# Patient Record
Sex: Female | Born: 1955 | Race: Black or African American | Hispanic: No | Smoking: Never smoker
Health system: Southern US, Community
[De-identification: ages and names within clinical notes are randomized; demographics above are authoritative.]

## PROBLEM LIST (undated history)

## (undated) DIAGNOSIS — I10 Essential (primary) hypertension: Secondary | ICD-10-CM

## (undated) DIAGNOSIS — E7800 Pure hypercholesterolemia, unspecified: Secondary | ICD-10-CM

## (undated) DIAGNOSIS — G40A09 Absence epileptic syndrome, not intractable, without status epilepticus: Secondary | ICD-10-CM

## (undated) DIAGNOSIS — Z8669 Personal history of other diseases of the nervous system and sense organs: Secondary | ICD-10-CM

## (undated) DIAGNOSIS — R569 Unspecified convulsions: Secondary | ICD-10-CM

## (undated) DIAGNOSIS — D219 Benign neoplasm of connective and other soft tissue, unspecified: Secondary | ICD-10-CM

## (undated) HISTORY — DX: Unspecified convulsions: R56.9

## (undated) HISTORY — PX: NOVASURE ABLATION: SHX5394

## (undated) HISTORY — PX: KNEE ARTHROSCOPY: SHX127

## (undated) HISTORY — PX: ROTATOR CUFF REPAIR: SHX139

## (undated) HISTORY — PX: LUMBAR FUSION: SHX111

## (undated) HISTORY — DX: Benign neoplasm of connective and other soft tissue, unspecified: D21.9

## (undated) HISTORY — DX: Personal history of other diseases of the nervous system and sense organs: Z86.69

---

## 2005-10-24 ENCOUNTER — Other Ambulatory Visit: Admission: RE | Admit: 2005-10-24 | Discharge: 2005-10-24 | Payer: Self-pay | Admitting: Obstetrics and Gynecology

## 2005-10-26 ENCOUNTER — Emergency Department (HOSPITAL_COMMUNITY): Admission: EM | Admit: 2005-10-26 | Discharge: 2005-10-26 | Payer: Self-pay | Admitting: Family Medicine

## 2006-01-31 ENCOUNTER — Encounter (INDEPENDENT_AMBULATORY_CARE_PROVIDER_SITE_OTHER): Payer: Self-pay | Admitting: *Deleted

## 2006-01-31 ENCOUNTER — Ambulatory Visit (HOSPITAL_COMMUNITY): Admission: RE | Admit: 2006-01-31 | Discharge: 2006-01-31 | Payer: Self-pay | Admitting: Obstetrics and Gynecology

## 2006-09-14 ENCOUNTER — Emergency Department (HOSPITAL_COMMUNITY): Admission: EM | Admit: 2006-09-14 | Discharge: 2006-09-14 | Payer: Self-pay | Admitting: Family Medicine

## 2007-01-20 ENCOUNTER — Encounter: Admission: RE | Admit: 2007-01-20 | Discharge: 2007-01-20 | Payer: Self-pay | Admitting: Internal Medicine

## 2007-02-06 ENCOUNTER — Encounter: Admission: RE | Admit: 2007-02-06 | Discharge: 2007-02-06 | Payer: Self-pay | Admitting: Internal Medicine

## 2007-04-12 ENCOUNTER — Emergency Department (HOSPITAL_COMMUNITY): Admission: EM | Admit: 2007-04-12 | Discharge: 2007-04-12 | Payer: Self-pay | Admitting: Emergency Medicine

## 2007-05-18 ENCOUNTER — Encounter: Admission: RE | Admit: 2007-05-18 | Discharge: 2007-05-18 | Payer: Self-pay | Admitting: Neurological Surgery

## 2007-05-22 ENCOUNTER — Emergency Department (HOSPITAL_COMMUNITY): Admission: EM | Admit: 2007-05-22 | Discharge: 2007-05-22 | Payer: Self-pay | Admitting: Emergency Medicine

## 2007-06-17 ENCOUNTER — Inpatient Hospital Stay (HOSPITAL_COMMUNITY): Admission: RE | Admit: 2007-06-17 | Discharge: 2007-06-19 | Payer: Self-pay | Admitting: Neurological Surgery

## 2007-06-26 ENCOUNTER — Emergency Department (HOSPITAL_COMMUNITY): Admission: EM | Admit: 2007-06-26 | Discharge: 2007-06-27 | Payer: Self-pay | Admitting: Emergency Medicine

## 2007-06-30 ENCOUNTER — Emergency Department: Payer: Self-pay | Admitting: Emergency Medicine

## 2007-06-30 ENCOUNTER — Emergency Department (HOSPITAL_COMMUNITY): Admission: EM | Admit: 2007-06-30 | Discharge: 2007-06-30 | Payer: Self-pay | Admitting: Emergency Medicine

## 2007-09-14 ENCOUNTER — Encounter: Admission: RE | Admit: 2007-09-14 | Discharge: 2007-09-14 | Payer: Self-pay | Admitting: Neurological Surgery

## 2007-12-07 ENCOUNTER — Encounter: Admission: RE | Admit: 2007-12-07 | Discharge: 2007-12-07 | Payer: Self-pay | Admitting: Neurological Surgery

## 2008-04-01 ENCOUNTER — Encounter: Admission: RE | Admit: 2008-04-01 | Discharge: 2008-04-01 | Payer: Self-pay | Admitting: Internal Medicine

## 2008-06-20 ENCOUNTER — Encounter: Admission: RE | Admit: 2008-06-20 | Discharge: 2008-06-20 | Payer: Self-pay | Admitting: Neurological Surgery

## 2008-08-20 IMAGING — CR DG LUMBAR SPINE COMPLETE 4+V
5 series · 5 of 5 positions shown · non-contrast
Comparison: Intraoperative views 06/17/07 and CT 05/18/07.

CLINICAL DATA: 51-year-old female with back and bilateral leg pain. Status-post fusion one week ago with new onset of bilateral leg pain. 
 LUMBAR SPINE - 5 VIEW:

[t l-spine a.p.]
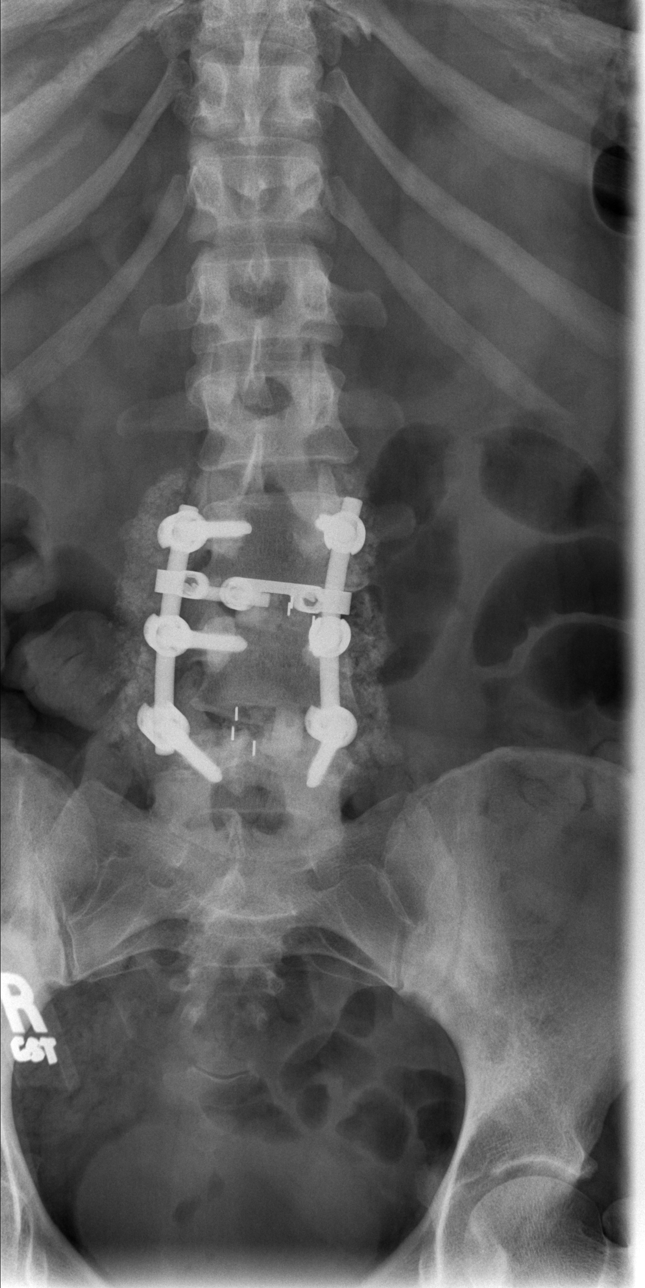

[t l-spine oblique exposure (1 of 2)]
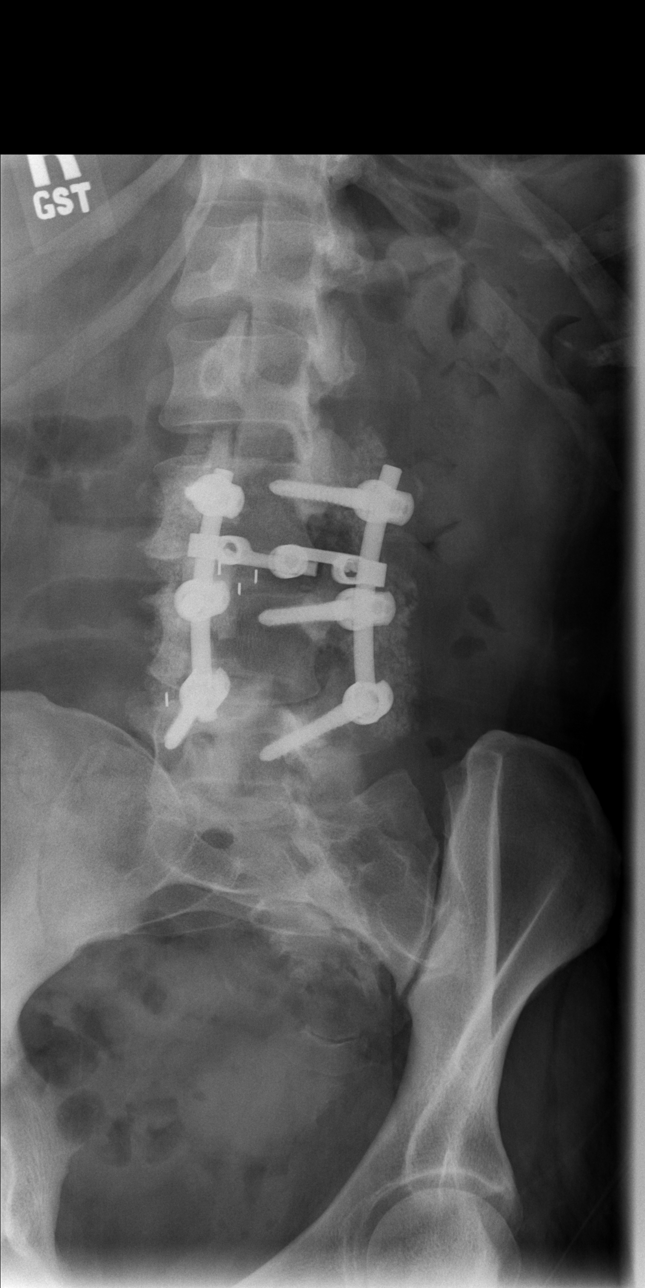

[t l-spine oblique exposure (2 of 2)]
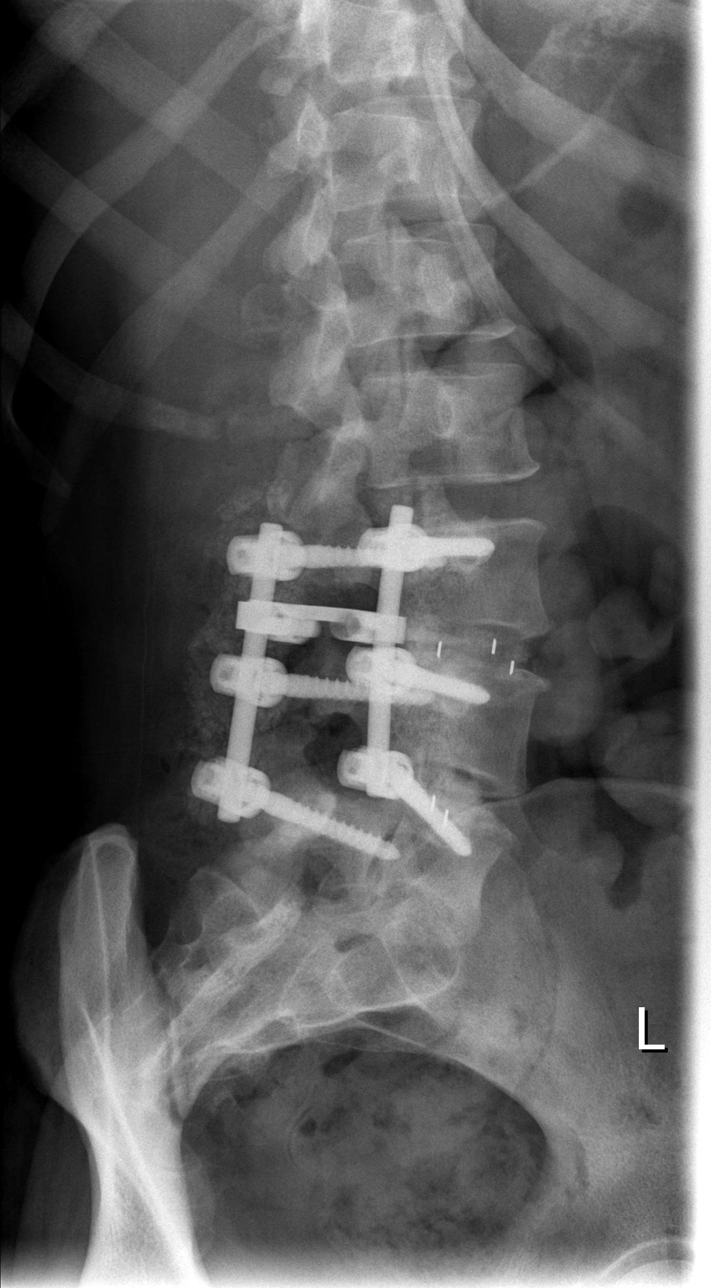

[t l-spine lat]
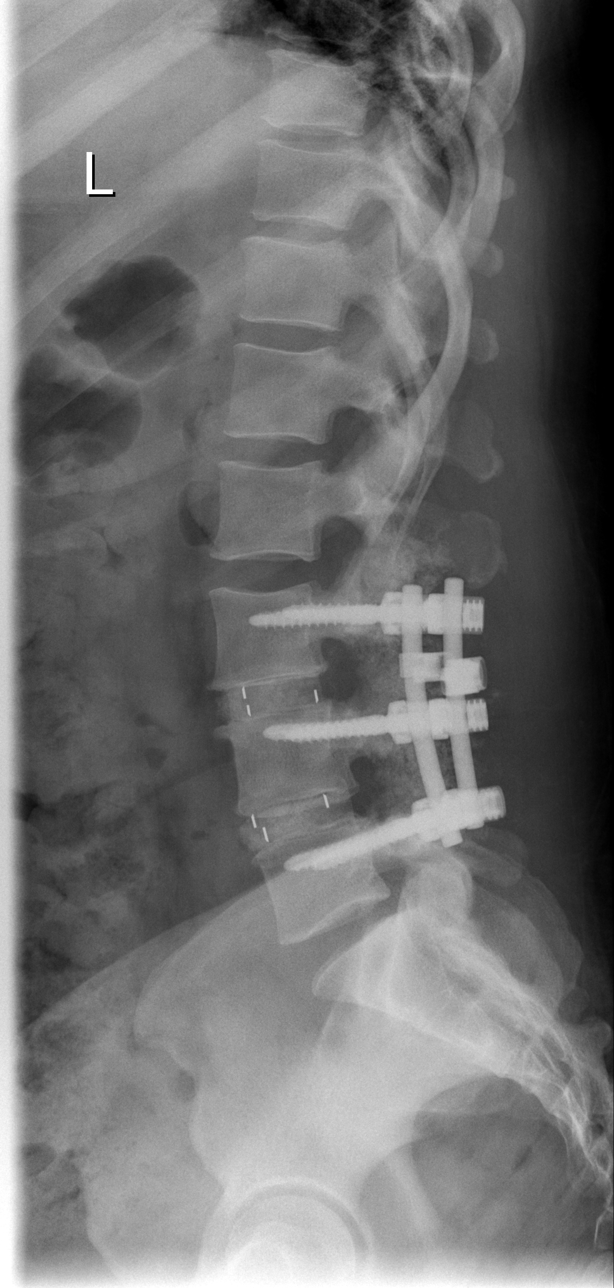

[t l-spine l5-s1 spot]
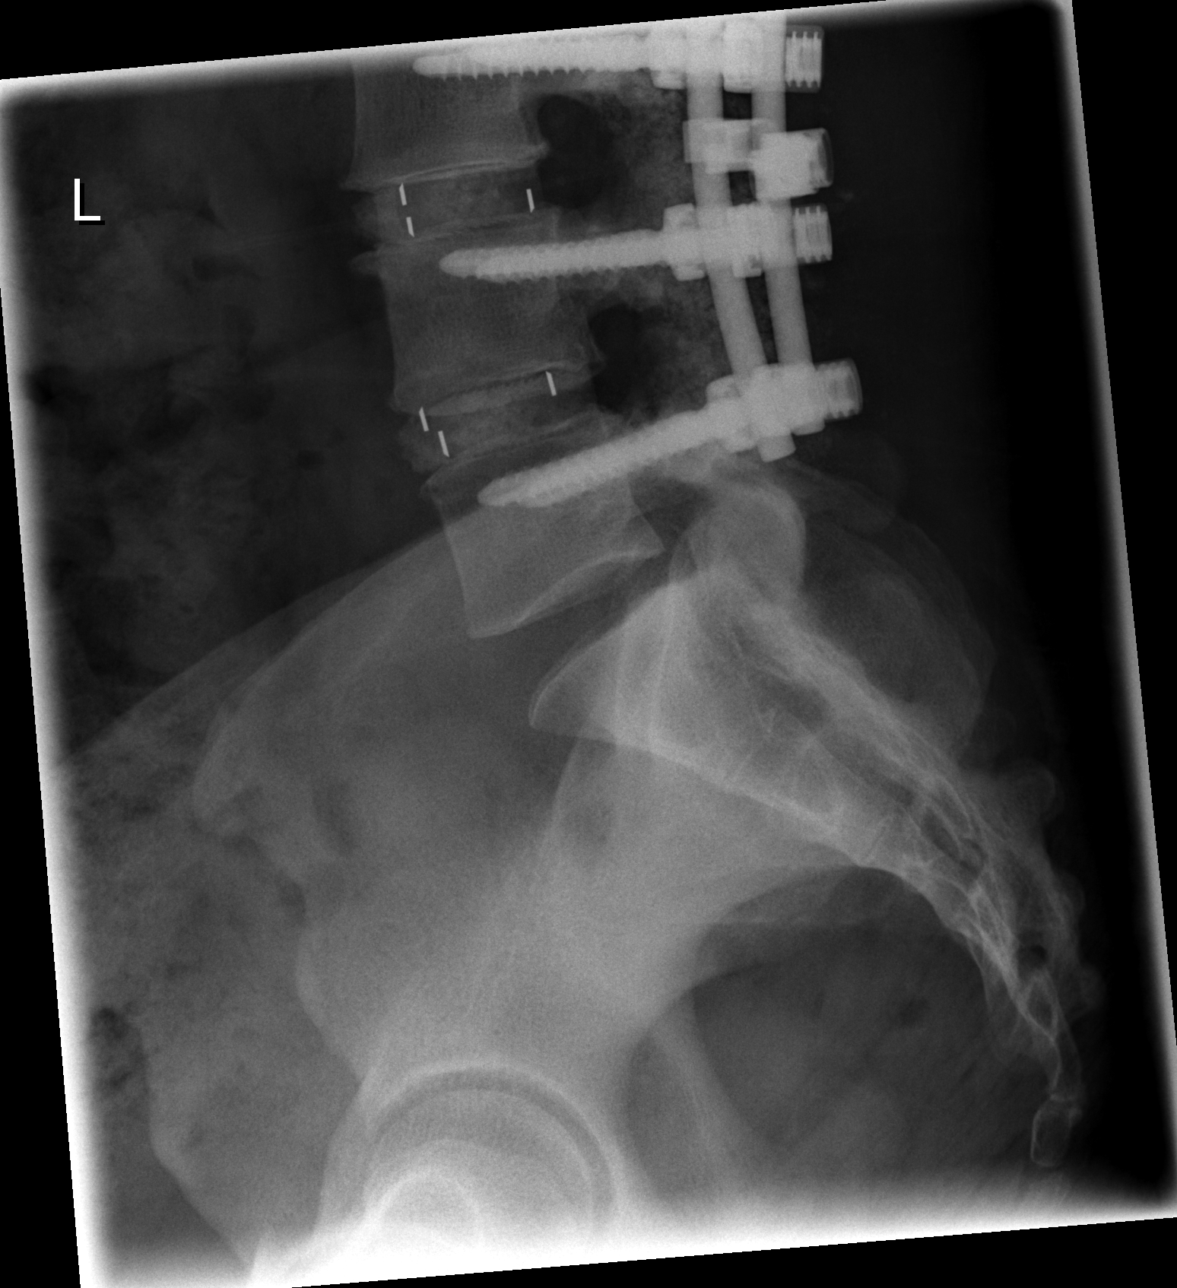

[5 of 5 positions shown; findings below may reference images not displayed]

FINDINGS: The patient is status-post PLIF L3-5. Pedicle screws are intact without significant lucency. Alignment is maintained. Graft material is seen posteriorly.  The patient is status-post wide laminectomy at L3-4.
IMPRESSION: PLIF L3-5 without radiographic evidence for complication.

## 2008-12-14 ENCOUNTER — Encounter: Admission: RE | Admit: 2008-12-14 | Discharge: 2008-12-14 | Payer: Self-pay | Admitting: Neurology

## 2009-01-14 ENCOUNTER — Emergency Department (HOSPITAL_COMMUNITY): Admission: EM | Admit: 2009-01-14 | Discharge: 2009-01-14 | Payer: Self-pay | Admitting: Emergency Medicine

## 2009-01-27 IMAGING — CR DG LUMBAR SPINE 1V
2 series · 2 of 2 positions shown · non-contrast
Comparison: 09/14/2007.

CLINICAL DATA: PLIF with low back pain.

LUMBAR SPINE - 1 VIEW

[t l-spine a.p.]
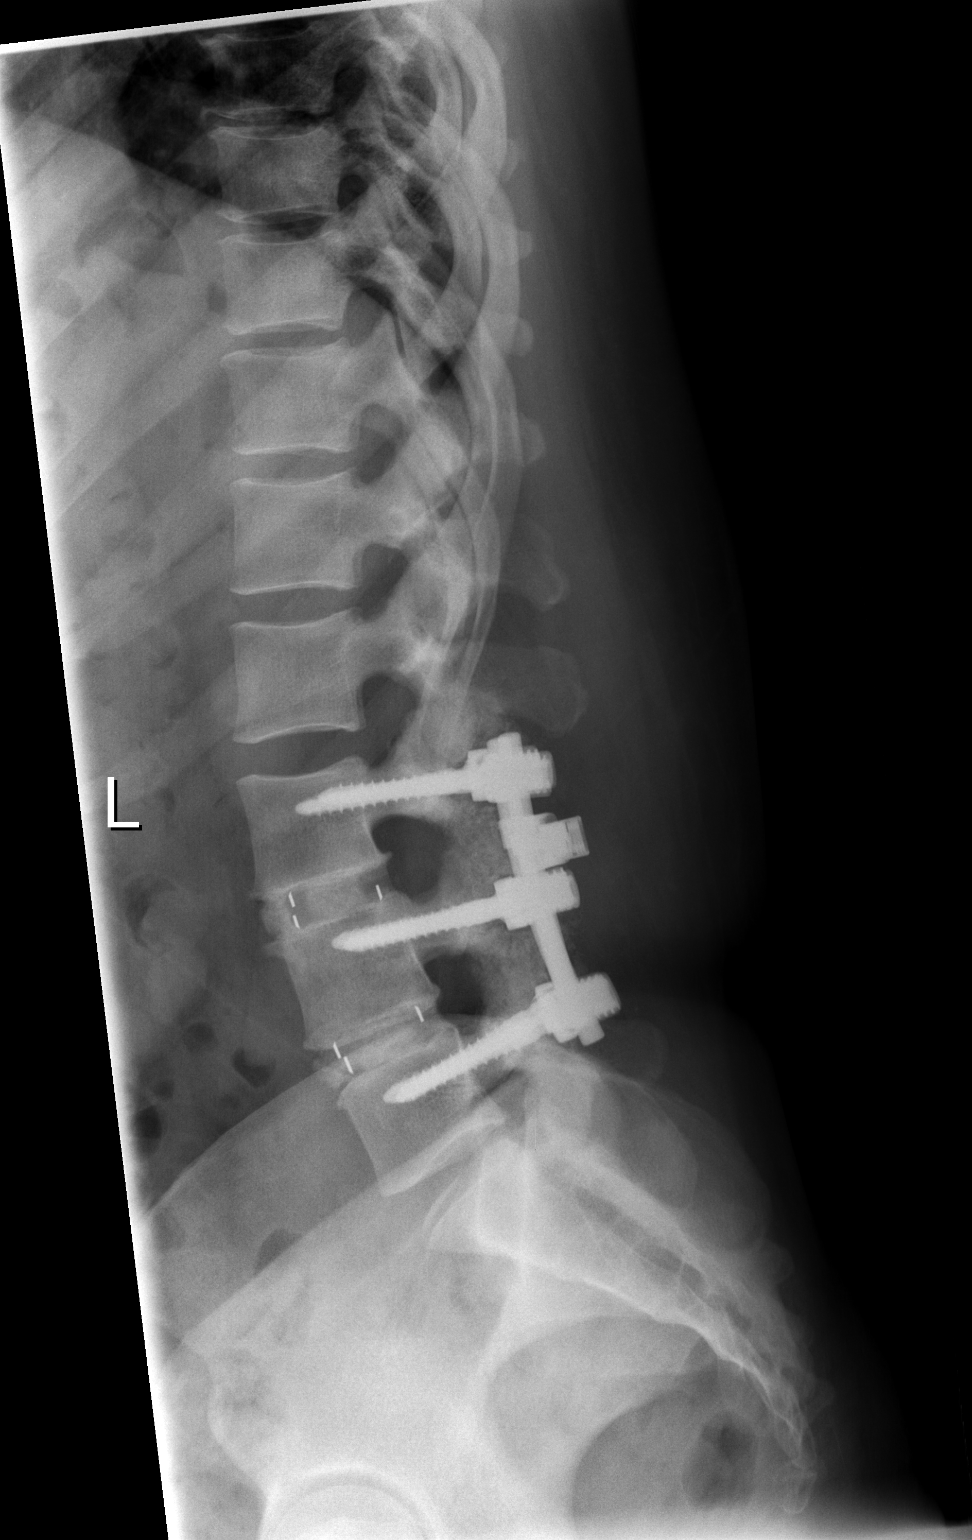

[t l-spine lat]
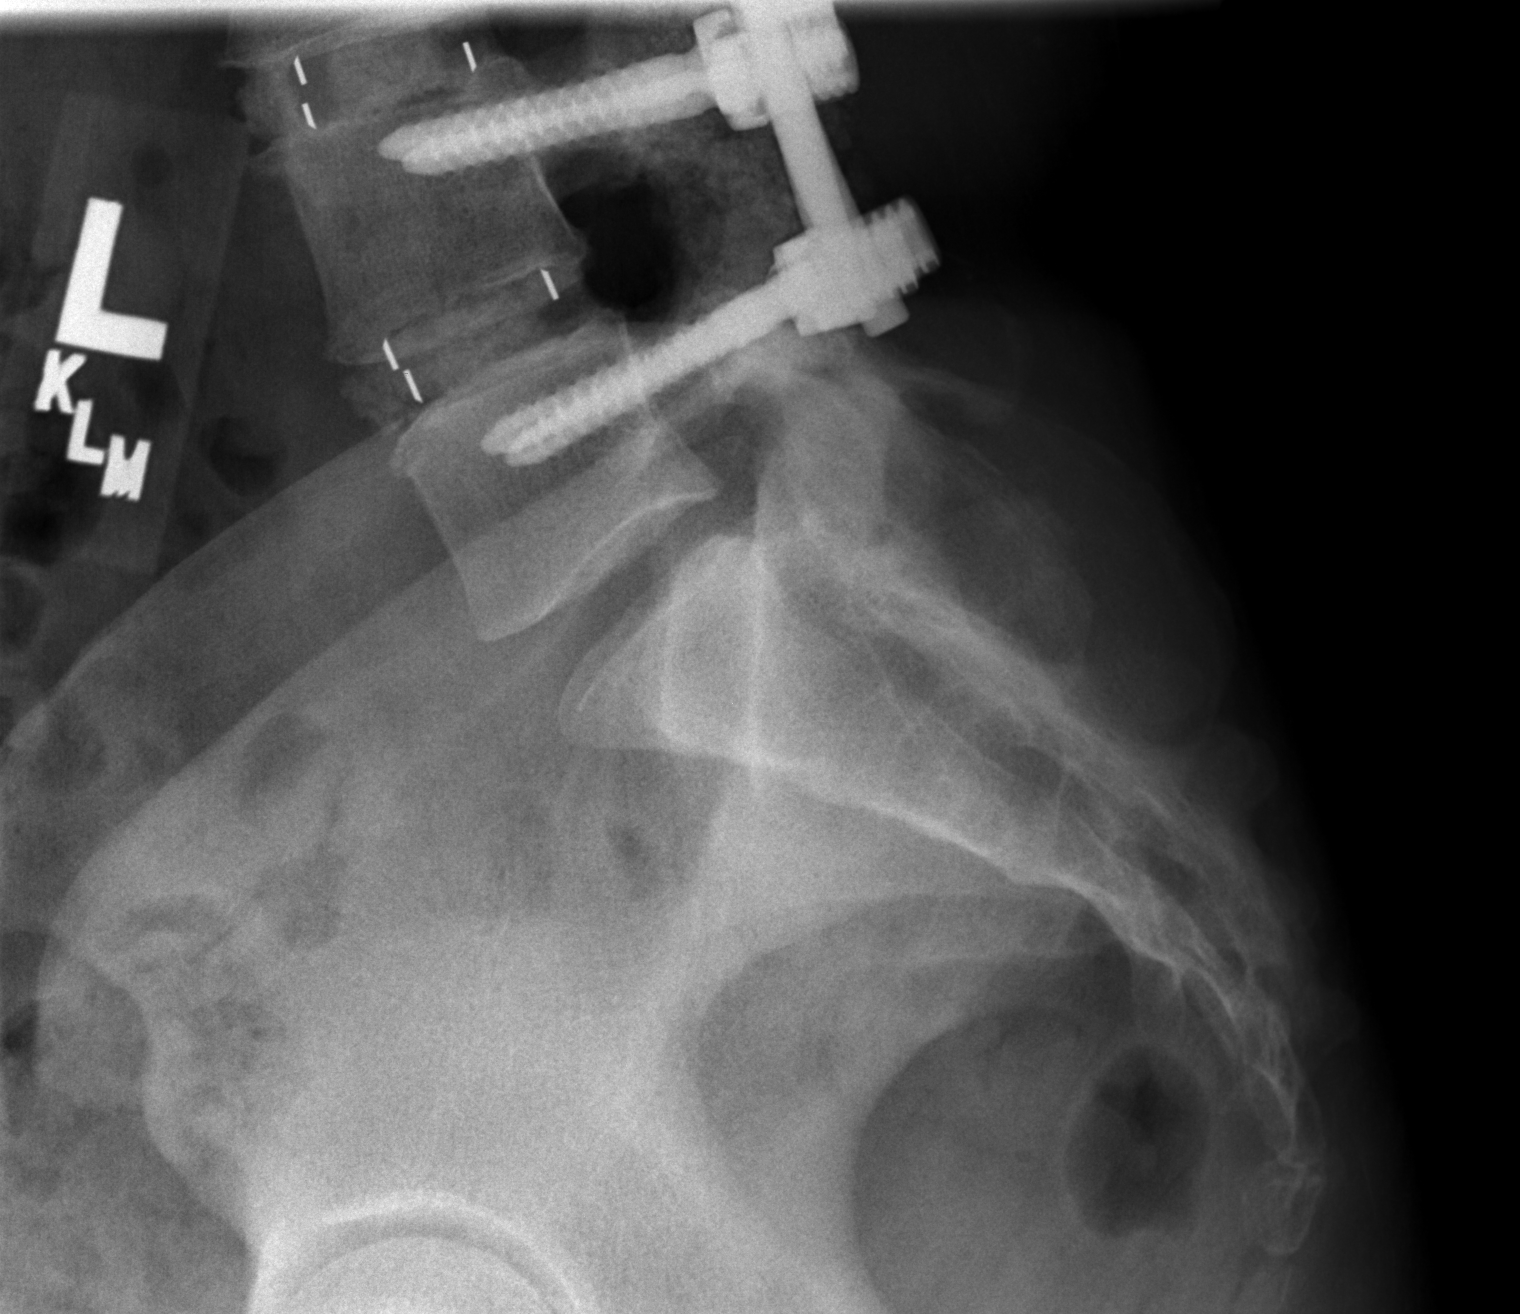

[2 of 2 positions shown; findings below may reference images not displayed]

FINDINGS: The patient is status post L3-5 posterior lumbar
interbody fusion with interbody graft.  No hardware lucencies.
Alignment anatomic.
IMPRESSION: Stable L3-5 PLIF.

## 2009-04-10 ENCOUNTER — Encounter: Admission: RE | Admit: 2009-04-10 | Discharge: 2009-04-10 | Payer: Self-pay | Admitting: Internal Medicine

## 2009-07-31 ENCOUNTER — Encounter: Admission: RE | Admit: 2009-07-31 | Discharge: 2009-07-31 | Payer: Self-pay | Admitting: Neurological Surgery

## 2009-08-31 ENCOUNTER — Encounter: Admission: RE | Admit: 2009-08-31 | Discharge: 2009-08-31 | Payer: Self-pay | Admitting: Neurological Surgery

## 2009-09-15 ENCOUNTER — Emergency Department (HOSPITAL_COMMUNITY): Admission: EM | Admit: 2009-09-15 | Discharge: 2009-09-15 | Payer: Self-pay | Admitting: Family Medicine

## 2010-06-24 ENCOUNTER — Encounter: Payer: Self-pay | Admitting: Neurological Surgery

## 2010-06-29 ENCOUNTER — Encounter
Admission: RE | Admit: 2010-06-29 | Discharge: 2010-06-29 | Payer: Self-pay | Source: Home / Self Care | Attending: Internal Medicine | Admitting: Internal Medicine

## 2010-08-21 LAB — GLUCOSE, CAPILLARY: Glucose-Capillary: 114 mg/dL — ABNORMAL HIGH (ref 70–99)

## 2010-10-16 NOTE — Op Note (Signed)
Kayla [name], [name]               ACCOUNT NO.:  [id number]   MEDICAL RECORD NO.:  [id number]          PATIENT TYPE:  INP   LOCATION:  3523                         FACILITY:  [facility]   PHYSICIAN:  Tia Alert, MD     DATE OF BIRTH:  01-05-1956   DATE OF PROCEDURE:  06/17/2007  DATE OF DISCHARGE:                               OPERATIVE REPORT   PREOPERATIVE DIAGNOSIS:  Lumbar spondylosis with degenerative disease  and lumbar spinal stenosis with facet arthropathy L3-4, L4-5 with back  and bilateral leg pain.   POSTOPERATIVE DIAGNOSIS:  Lumbar spondylosis with degenerative disease  and lumbar spinal stenosis with facet arthropathy L3-4, L4-5 with back  and bilateral leg pain.   PROCEDURES:  1. Decompressive lumbar laminectomy, hemi facetectomy, foraminotomy to      L3-4, L4-5 for decompression L3, L4 and L5 nerve roots requiring      more work than is typically required in a typical procedure.  2. Posterior lumbar interbody fusion L3-4, L4-5 utilizing 8 x 22 mm      tangent interbody bone wedges and 8 x 22 mm PEEK interbody cages      packed with local autograft and Actifuse.  3. Intertransverse arthrodesis L3-L5 bilaterally utilizing local      autograft and Actifuse putty.  4. Segmental fixation L3-L5 utilizing the [facility] pedicle screw      system.   SURGEON:  Dr. Marikay Alar.   ASSISTANT:  Donalee Citrin, M.D.   ANESTHESIA:  General endotracheal.   COMPLICATIONS:  None apparent.   INDICATIONS FOR PROCEDURE:  [name] is a 55 year old female who  presented with severe unrelenting back pain that has progressively  worsened over time.  She had MRI and CT scan which showed severe facet  arthropathy L3-4, L4-5 with degenerative disease with modic endplate  changes with spinal stenosis at L3-4, L4-5.  I recommended a  decompressive lumbar laminectomy with instrumented fusion to address  degenerative disease and spinal stenosis and facet arthropathy in hopes  of improving her  pain syndrome.  She understood the risks, benefits,  expected outcome and wished to proceed.   DESCRIPTION OF PROCEDURE:  The patient was taken to operating room and  after induction of adequate generalized endotracheal anesthesia, she was  rolled into the prone position on chest rolls and all pressure points  were padded.  Her lumbar region was prepped DuraPrep and draped in usual  sterile fashion.  10 mL local anesthesia was injected and a dorsal  midline incision was made and carried down to the lumbosacral fascia.  The fascia was opened and the paraspinous musculature was taken down in  subperiosteal fashion to expose L3-4, L4-5 and intraoperative  fluoroscopy confirmed my level and then I took the dissection out over  the facets to expose the transverse processes of L3, L4 and L5.  I then  used the Leksell rongeur and Kerrison punches to perform complete  laminectomies, hemi facetectomy and foraminotomies at L3-4, L4-5.  The  underlying yellow ligament was removed.  The facets were very arthritic  and quite unstable.  She had quite  a bit of stenosis at L3-4 and L4-5  with decompression L4-L5 nerve roots, fairly severe stenosis from facet  arthropathy and ligamentum flavum overgrowth.  Once the decompression  was complete we coagulated the epidural venous vasculature and cut it  sharply and then incised the disk space at L3-4 bilaterally and  performed initial diskectomy with pituitary rongeurs.  We then  distracted disk space to 8 mm and then used a combination of rotating  cutter, round scraper and 8 mm cutting chisel to prepare the endplates  bilaterally.  The midline was prepared and the cartilaginous endplate  was removed to prepare for arthrodesis and 8 x 22 mm tangent interbody  bone wedge was placed at L3-4 on the right and a PEEK interbody cage  packed with local autograft and Actifuse placed on the left side.  The  midline was packed with Actifuse and local autograft.  The  exact same  procedure was used at L4-5 distracting the disk space to 8 mm and  preparing the endplates with a rotating cutter, round scraper and  cutting chisel.  We then used a 8 x 22 mm PEEK interbody cage packed  with local autograft and Actifuse at L4-5 on the right side and used a  tangent interbody bone wedge at L4-5 on the left.  The midline again was  packed local autograft and Actifuse.  We localized the pedicle screw  entry zones utilizing surface landmarks and lateral fluoroscopy.  We  probed each pedicle with a pedicle probe, tapped each pedicle with a 4.5  tap and then placed 5.5 x 40 mm pedicle screws in the L3, L4, L5  pedicles bilaterally.  We then decorticated transverse processes  bilaterally and laid a mixture of local autograft and Actifuse out over  these to perform intertransverse arthrodesis L3-L5.  We then placed  lordotic rods into the multiaxial screw heads of the pedicle screws and  locked these into position with a locking caps and antitorque device  while achieving compression of our grafts.  We placed a separate cross-  link, we irrigated with saline solution with bacitracin, dried all  bleeding points, expected nerve roots once again with a coronary dilator  to assure adequate decompression, lined the dura with Gelfoam, placed a  medium Hemovac drain through a separate stab incision and closed the  muscle and fascia with 0-0 Vicryl closing subcutaneous and subcuticular  tissue with 2-0 and 3-0 Vicryl and closed skin with Benzoin Steri-  Strips.  The drapes removed.  Sterile dressing was applied.  The patient  was awakened from general anesthesia and transferred to recovery room in  stable condition.  At the end of procedure all sponge, needle and  instrument counts were correct.      Tia Alert, MD  Electronically Signed     DSJ/MEDQ  D:  06/17/2007  T:  06/18/2007  Job:  [id number]

## 2010-10-19 NOTE — Op Note (Signed)
Kayla [name], Kayla [name]               ACCOUNT NO.:  [id number]   MEDICAL RECORD NO.:  [id number]          PATIENT TYPE:  AMB   LOCATION:  SDC                           FACILITY:  [facility]   PHYSICIAN:  Dois Davenport A. Rivard, M.D. DATE OF BIRTH:  1956/03/03   DATE OF PROCEDURE:  01/31/2006  DATE OF DISCHARGE:                                 OPERATIVE REPORT   PREOPERATIVE DIAGNOSIS:  Menorrhagia.   POSTOPERATIVE DIAGNOSIS:  Menorrhagia.   ANESTHESIA:  General.   PROCEDURE:  Hysteroscopy dilatation and curettage, endometrial ablation with  NovaSure.   SURGEON:  Dr. Estanislado Pandy.   ESTIMATED BLOOD LOSS:  Minimal.   PROCEDURE:  After being informed of the planned procedure with possible  complications including bleeding, infection, uterine perforation with intra-  abdominal organ injury, informed consent is obtained.  The patient is taken  to OR #3, given general anesthesia with laryngeal mask without difficulty.  She is placed in a lithotomy position, prepped and draped in a sterile  fashion.  Her bladder is emptied with an in-and-out red rubber catheter.  GYN exam reveals an anteverted uterus, slightly bulky, but otherwise  unremarkable, two normal adnexa.  A weighted speculum is inserted.  Anterior  lip of the cervix was grasped with tenaculum forceps, and we proceeded with  a paracervical block using 16 mL of Nesacaine 1%.  Uterus is then sounded at  9 cm.  Cervix length is 3.5 cm.  The cervix was then dilated using Hegar  dilator until #19 which allows easy entry of the diagnostic hysteroscope.  With perfusion of LR at 80 mmHg, we are able to visualize the entire  endometrial cavity which appears normal, including both tubal ostia.  The  hysteroscope was removed.  The NovaSure is inserted with a cavity width of  4.7 cm.  The integrity of the cavity is assessed and normal.  We proceeded  with endometrial ablation at a power of 142 during a time of 85 seconds.  The NovaSure is then closed and  removed.  Diagnostic hysteroscope is  reinserted to verify and confirm complete blanching of the endometrial  cavity.  Instruments are then removed.  Instrument and sponge counts are  complete x2.  Estimated blood loss is minimal.  Water deficit is 40 mL.  The  procedure is very well tolerated by the patient who is taken to recovery  room in a well and stable condition.      Crist Fat Rivard, M.D.  Electronically Signed     SAR/MEDQ  D:  01/31/2006  T:  01/31/2006  Job:  [id number]

## 2010-12-24 ENCOUNTER — Inpatient Hospital Stay (INDEPENDENT_AMBULATORY_CARE_PROVIDER_SITE_OTHER)
Admission: RE | Admit: 2010-12-24 | Discharge: 2010-12-24 | Disposition: A | Discharge status: Home / Self Care | Payer: Self-pay | Source: Ambulatory Visit | Attending: Emergency Medicine | Admitting: Emergency Medicine

## 2010-12-24 ENCOUNTER — Emergency Department: Payer: Self-pay | Admitting: General Practice

## 2010-12-24 DIAGNOSIS — S139XXA Sprain of joints and ligaments of unspecified parts of neck, initial encounter: Secondary | ICD-10-CM

## 2011-02-21 LAB — I-STAT 8, (EC8 V) (CONVERTED LAB)
Acid-Base Excess: 3 — ABNORMAL HIGH
Chloride: 99
HCT: 29 — ABNORMAL LOW
Hemoglobin: 9.9 — ABNORMAL LOW
Operator id: 133351
Potassium: 4.2
Sodium: 134 — ABNORMAL LOW
TCO2: 30

## 2011-02-21 LAB — URINE MICROSCOPIC-ADD ON

## 2011-02-21 LAB — URINALYSIS, ROUTINE W REFLEX MICROSCOPIC
Bilirubin Urine: NEGATIVE
Glucose, UA: NEGATIVE
Ketones, ur: 15 — AB
Protein, ur: NEGATIVE

## 2011-02-21 LAB — APTT: aPTT: 27

## 2011-02-21 LAB — BASIC METABOLIC PANEL
BUN: 10
Chloride: 102
Creatinine, Ser: 0.86
GFR calc Af Amer: >60
Potassium: 3.9
Sodium: 135

## 2011-02-21 LAB — CBC
HCT: 40.3
Hemoglobin: 13.7
MCHC: 34
MCV: 89.7
Platelets: 267
RBC: 4.49
RDW: 12.8

## 2011-02-21 LAB — POCT I-STAT CREATININE: Operator id: 133351

## 2011-03-08 LAB — COMPREHENSIVE METABOLIC PANEL
AST: 18
Albumin: 3.7
BUN: 12
CO2: 26
Calcium: 8.6
Chloride: 99
Creatinine, Ser: 0.8
GFR calc Af Amer: >60
GFR calc non Af Amer: >60
Total Bilirubin: 0.8

## 2011-03-08 LAB — URINALYSIS, ROUTINE W REFLEX MICROSCOPIC
Bilirubin Urine: NEGATIVE
Glucose, UA: NEGATIVE
Hgb urine dipstick: NEGATIVE
Protein, ur: NEGATIVE
Urobilinogen, UA: 0.2

## 2011-03-08 LAB — CBC
HCT: 37.8
MCHC: 34.7
MCV: 87.3
Platelets: 316
WBC: 7.9

## 2011-03-08 LAB — DIFFERENTIAL
Basophils Absolute: 0
Lymphocytes Relative: 23
Lymphs Abs: 1.8
Neutro Abs: 5.5

## 2011-03-08 LAB — LIPASE, BLOOD: Lipase: 17

## 2011-05-09 ENCOUNTER — Other Ambulatory Visit: Payer: Self-pay | Admitting: Orthopedic Surgery

## 2011-05-10 ENCOUNTER — Encounter (HOSPITAL_COMMUNITY): Payer: Self-pay

## 2011-05-14 ENCOUNTER — Ambulatory Visit (HOSPITAL_COMMUNITY)
Admission: RE | Admit: 2011-05-14 | Discharge: 2011-05-14 | Disposition: A | Discharge status: Home / Self Care | Payer: Worker's Compensation | Source: Ambulatory Visit | Attending: Orthopedic Surgery | Admitting: Orthopedic Surgery

## 2011-05-14 ENCOUNTER — Encounter (HOSPITAL_COMMUNITY): Payer: Self-pay

## 2011-05-14 ENCOUNTER — Encounter (HOSPITAL_COMMUNITY)
Admission: RE | Admit: 2011-05-14 | Discharge: 2011-05-14 | Disposition: A | Discharge status: Home / Self Care | Payer: Worker's Compensation | Source: Ambulatory Visit | Attending: Orthopedic Surgery | Admitting: Orthopedic Surgery

## 2011-05-14 HISTORY — DX: Essential (primary) hypertension: I10

## 2011-05-14 HISTORY — DX: Pure hypercholesterolemia, unspecified: E78.00

## 2011-05-14 HISTORY — DX: Absence epileptic syndrome, not intractable, without status epilepticus: G40.A09

## 2011-05-14 LAB — DIFFERENTIAL
Basophils Absolute: 0 10*3/uL (ref 0.0–0.1)
Eosinophils Relative: 2 % (ref 0–5)
Lymphocytes Relative: 32 % (ref 12–46)
Neutro Abs: 4.7 10*3/uL (ref 1.7–7.7)

## 2011-05-14 LAB — URINALYSIS, ROUTINE W REFLEX MICROSCOPIC
Bilirubin Urine: NEGATIVE
Hgb urine dipstick: NEGATIVE
Specific Gravity, Urine: 1.024 (ref 1.005–1.030)
Urobilinogen, UA: 1 mg/dL (ref 0.0–1.0)

## 2011-05-14 LAB — CBC
MCV: 90.8 fL (ref 78.0–100.0)
Platelets: 263 10*3/uL (ref 150–400)
RDW: 13.3 % (ref 11.5–15.5)
WBC: 8 10*3/uL (ref 4.0–10.5)

## 2011-05-14 LAB — BASIC METABOLIC PANEL
CO2: 31 mEq/L (ref 19–32)
Chloride: 97 mEq/L (ref 96–112)
Creatinine, Ser: 0.8 mg/dL (ref 0.50–1.10)
Potassium: 4.1 mEq/L (ref 3.5–5.1)
Sodium: 137 mEq/L (ref 135–145)

## 2011-05-14 LAB — PROTIME-INR: Prothrombin Time: 12.7 seconds (ref 11.6–15.2)

## 2011-05-14 LAB — SURGICAL PCR SCREEN: Staphylococcus aureus: NEGATIVE

## 2011-05-14 LAB — APTT: aPTT: 30 seconds (ref 24–37)

## 2011-05-14 LAB — TYPE AND SCREEN: Antibody Screen: NEGATIVE

## 2011-05-14 MED ORDER — CEFAZOLIN SODIUM-DEXTROSE 2-3 GM-% IV SOLR
2.0000 g | INTRAVENOUS | Status: AC
  Administered 2011-05-15: 2 g via INTRAVENOUS
  Filled 2011-05-14 (×2): qty 50

## 2011-05-14 NOTE — Progress Notes (Signed)
Cardiologist notes reviewed with Revonda Standard, [place]

## 2011-05-14 NOTE — Pre-Procedure Instructions (Signed)
20 Kayla [name]  05/14/2011   Your procedure is scheduled on:  May 15, 2011  Report to [facility] at 0630 AM.  Call this number if you have problems the morning of surgery: [phone]   Remember:   Do not eat food:After Midnight.  May have clear liquids: up to 4 Hours before arrival.  Clear liquids include soda, tea, black coffee, apple or grape juice, broth.  Take these medicines the morning of surgery with A SIP OF WATER: Soma, Hydrocodone, Lamictal, Tramadol, Progesterone    Do not wear jewelry, make-up or nail polish.  Do not wear lotions, powders, or perfumes. You may wear deodorant.  Do not shave 48 hours prior to surgery.  Do not bring valuables to the hospital.  Contacts, dentures or bridgework may not be worn into surgery.  Leave suitcase in the car. After surgery it may be brought to your room.  For patients admitted to the hospital, checkout time is 11:00 AM the day of discharge.   Special Instructions: Incentive Spirometry - Practice and bring it with you on the day of surgery. and CHG Shower Use Special Wash: 1/2 bottle night before surgery and 1/2 bottle morning of surgery.   Please read over the following fact sheets that you were given: Pain Booklet, Coughing and Deep Breathing, Blood Transfusion Information and Surgical Site Infection Prevention

## 2011-05-15 ENCOUNTER — Encounter (HOSPITAL_COMMUNITY): Payer: Self-pay | Admitting: Anesthesiology

## 2011-05-15 ENCOUNTER — Encounter (HOSPITAL_COMMUNITY)
Admission: RE | Disposition: A | Discharge status: Home / Self Care | Payer: Self-pay | Source: Ambulatory Visit | Attending: Orthopedic Surgery

## 2011-05-15 ENCOUNTER — Inpatient Hospital Stay (HOSPITAL_COMMUNITY)
Admission: RE | Admit: 2011-05-15 | Discharge: 2011-05-18 | DRG: 460 | Disposition: A | Discharge status: Home / Self Care | Payer: Worker's Compensation | Source: Ambulatory Visit | Attending: Orthopedic Surgery | Admitting: Orthopedic Surgery

## 2011-05-15 ENCOUNTER — Inpatient Hospital Stay (HOSPITAL_COMMUNITY): Payer: Worker's Compensation

## 2011-05-15 ENCOUNTER — Encounter (HOSPITAL_COMMUNITY): Payer: Self-pay | Admitting: *Deleted

## 2011-05-15 ENCOUNTER — Inpatient Hospital Stay (HOSPITAL_COMMUNITY): Payer: Worker's Compensation | Admitting: Anesthesiology

## 2011-05-15 DIAGNOSIS — E119 Type 2 diabetes mellitus without complications: Secondary | ICD-10-CM | POA: Diagnosis present

## 2011-05-15 DIAGNOSIS — E7800 Pure hypercholesterolemia, unspecified: Secondary | ICD-10-CM | POA: Diagnosis present

## 2011-05-15 DIAGNOSIS — M79605 Pain in left leg: Secondary | ICD-10-CM

## 2011-05-15 DIAGNOSIS — M549 Dorsalgia, unspecified: Secondary | ICD-10-CM

## 2011-05-15 DIAGNOSIS — M5137 Other intervertebral disc degeneration, lumbosacral region: Principal | ICD-10-CM | POA: Diagnosis present

## 2011-05-15 DIAGNOSIS — M51379 Other intervertebral disc degeneration, lumbosacral region without mention of lumbar back pain or lower extremity pain: Principal | ICD-10-CM | POA: Diagnosis present

## 2011-05-15 DIAGNOSIS — G40309 Generalized idiopathic epilepsy and epileptic syndromes, not intractable, without status epilepticus: Secondary | ICD-10-CM | POA: Diagnosis present

## 2011-05-15 DIAGNOSIS — I10 Essential (primary) hypertension: Secondary | ICD-10-CM | POA: Diagnosis present

## 2011-05-15 LAB — GLUCOSE, CAPILLARY
Glucose-Capillary: 134 mg/dL — ABNORMAL HIGH (ref 70–99)
Glucose-Capillary: 161 mg/dL — ABNORMAL HIGH (ref 70–99)
Glucose-Capillary: 163 mg/dL — ABNORMAL HIGH (ref 70–99)
Glucose-Capillary: 207 mg/dL — ABNORMAL HIGH (ref 70–99)

## 2011-05-15 SURGERY — POSTERIOR LUMBAR FUSION 1 WITH HARDWARE REMOVAL
Anesthesia: General | Site: Spine Lumbar | Laterality: Left | Wound class: Clean

## 2011-05-15 MED ORDER — LACTATED RINGERS IV SOLN
INTRAVENOUS | Status: DC | PRN
  Administered 2011-05-15 (×2): via INTRAVENOUS

## 2011-05-15 MED ORDER — CARVEDILOL 6.25 MG PO TABS
6.2500 mg | ORAL_TABLET | Freq: Two times a day (BID) | ORAL | Status: DC
  Administered 2011-05-15 – 2011-05-18 (×6): 6.25 mg via ORAL
  Filled 2011-05-15 (×8): qty 1

## 2011-05-15 MED ORDER — LISINOPRIL 20 MG PO TABS
20.0000 mg | ORAL_TABLET | Freq: Every day | ORAL | Status: DC
  Administered 2011-05-15 – 2011-05-17 (×3): 20 mg via ORAL
  Filled 2011-05-15 (×4): qty 1

## 2011-05-15 MED ORDER — HYDROXYZINE HCL 50 MG PO TABS
50.0000 mg | ORAL_TABLET | ORAL | Status: DC | PRN
  Filled 2011-05-15: qty 1

## 2011-05-15 MED ORDER — MENTHOL 3 MG MT LOZG: 1.0000 | LOZENGE | OROMUCOSAL | Status: DC | PRN

## 2011-05-15 MED ORDER — HYDROCHLOROTHIAZIDE 25 MG PO TABS
12.5000 mg | ORAL_TABLET | Freq: Every day | ORAL | Status: DC
  Administered 2011-05-16: 12.5 mg via ORAL
  Filled 2011-05-15 (×2): qty 0.5

## 2011-05-15 MED ORDER — ACETAMINOPHEN 10 MG/ML IV SOLN
INTRAVENOUS | Status: DC | PRN
  Administered 2011-05-15: 1000 mg via INTRAVENOUS

## 2011-05-15 MED ORDER — NALOXONE HCL 0.4 MG/ML IJ SOLN: 0.4000 mg | INTRAMUSCULAR | Status: DC | PRN

## 2011-05-15 MED ORDER — LAMOTRIGINE 100 MG PO TABS
100.0000 mg | ORAL_TABLET | Freq: Two times a day (BID) | ORAL | Status: DC
  Administered 2011-05-15 – 2011-05-18 (×6): 100 mg via ORAL
  Filled 2011-05-15 (×7): qty 1

## 2011-05-15 MED ORDER — SODIUM CHLORIDE 0.9 % IJ SOLN
3.0000 mL | Freq: Two times a day (BID) | INTRAMUSCULAR | Status: DC
  Administered 2011-05-15 – 2011-05-18 (×4): 3 mL via INTRAVENOUS

## 2011-05-15 MED ORDER — HYDROMORPHONE HCL PF 1 MG/ML IJ SOLN
INTRAMUSCULAR | Status: AC
  Filled 2011-05-15: qty 1

## 2011-05-15 MED ORDER — METFORMIN HCL 500 MG PO TABS
500.0000 mg | ORAL_TABLET | Freq: Two times a day (BID) | ORAL | Status: DC
  Administered 2011-05-15 – 2011-05-18 (×6): 500 mg via ORAL
  Filled 2011-05-15 (×8): qty 1

## 2011-05-15 MED ORDER — METOCLOPRAMIDE HCL 5 MG/ML IJ SOLN
INTRAMUSCULAR | Status: DC | PRN
  Administered 2011-05-15: 10 mg via INTRAVENOUS

## 2011-05-15 MED ORDER — CEFAZOLIN SODIUM 1-5 GM-% IV SOLN
1.0000 g | Freq: Three times a day (TID) | INTRAVENOUS | Status: AC
  Administered 2011-05-15 – 2011-05-16 (×2): 1 g via INTRAVENOUS
  Filled 2011-05-15 (×3): qty 50

## 2011-05-15 MED ORDER — QUINAPRIL HCL 10 MG PO TABS: 20.0000 mg | ORAL_TABLET | Freq: Every day | ORAL | Status: DC

## 2011-05-15 MED ORDER — ROCURONIUM BROMIDE 100 MG/10ML IV SOLN
INTRAVENOUS | Status: DC | PRN
  Administered 2011-05-15: 50 mg via INTRAVENOUS

## 2011-05-15 MED ORDER — SODIUM CHLORIDE 0.9 % IV SOLN: 250.0000 mL | INTRAVENOUS | Status: DC

## 2011-05-15 MED ORDER — ZOLPIDEM TARTRATE 5 MG PO TABS: 5.0000 mg | ORAL_TABLET | Freq: Every evening | ORAL | Status: DC | PRN

## 2011-05-15 MED ORDER — SODIUM CHLORIDE 0.9 % IV SOLN: INTRAVENOUS | Status: DC | PRN

## 2011-05-15 MED ORDER — POVIDONE-IODINE 7.5 % EX SOLN: Freq: Once | CUTANEOUS | Status: DC

## 2011-05-15 MED ORDER — ALUM & MAG HYDROXIDE-SIMETH 400-400-40 MG/5ML PO SUSP: 30.0000 mL | Freq: Four times a day (QID) | ORAL | Status: DC | PRN

## 2011-05-15 MED ORDER — MORPHINE SULFATE 2 MG/ML IJ SOLN
2.0000 mg | INTRAMUSCULAR | Status: DC | PRN
  Administered 2011-05-16 – 2011-05-18 (×9): 2 mg via INTRAVENOUS
  Filled 2011-05-15 (×9): qty 1

## 2011-05-15 MED ORDER — DEXAMETHASONE SODIUM PHOSPHATE 4 MG/ML IJ SOLN
INTRAMUSCULAR | Status: DC | PRN
  Administered 2011-05-15: 8 mg via INTRAVENOUS

## 2011-05-15 MED ORDER — ONDANSETRON HCL 4 MG/2ML IJ SOLN
INTRAMUSCULAR | Status: DC | PRN
  Administered 2011-05-15: 4 mg via INTRAVENOUS

## 2011-05-15 MED ORDER — DIAZEPAM 5 MG PO TABS
5.0000 mg | ORAL_TABLET | Freq: Four times a day (QID) | ORAL | Status: DC
  Administered 2011-05-15 – 2011-05-17 (×6): 5 mg via ORAL
  Filled 2011-05-15 (×7): qty 1

## 2011-05-15 MED ORDER — THROMBIN 20000 UNITS EX KIT
PACK | CUTANEOUS | Status: DC | PRN
  Administered 2011-05-15 (×3): 20000 [IU] via TOPICAL

## 2011-05-15 MED ORDER — THERA M PLUS PO TABS
1.0000 | ORAL_TABLET | Freq: Every day | ORAL | Status: DC
  Administered 2011-05-15 – 2011-05-18 (×4): 1 via ORAL
  Filled 2011-05-15 (×4): qty 1

## 2011-05-15 MED ORDER — HETASTARCH-ELECTROLYTES 6 % IV SOLN
INTRAVENOUS | Status: DC | PRN
  Administered 2011-05-15: 12:00:00 via INTRAVENOUS

## 2011-05-15 MED ORDER — DIPHENHYDRAMINE HCL 50 MG/ML IJ SOLN
12.5000 mg | Freq: Four times a day (QID) | INTRAMUSCULAR | Status: DC | PRN
  Administered 2011-05-16 (×2): 12.5 mg via INTRAVENOUS
  Filled 2011-05-15 (×2): qty 1

## 2011-05-15 MED ORDER — VECURONIUM BROMIDE 10 MG IV SOLR
INTRAVENOUS | Status: DC | PRN
  Administered 2011-05-15: 2 mg via INTRAVENOUS

## 2011-05-15 MED ORDER — PHENOL 1.4 % MT LIQD
1.0000 | OROMUCOSAL | Status: DC | PRN
  Filled 2011-05-15: qty 177

## 2011-05-15 MED ORDER — HYDROMORPHONE 0.3 MG/ML IV SOLN
INTRAVENOUS | Status: DC
  Administered 2011-05-15: 2.79 mg via INTRAVENOUS
  Administered 2011-05-15: 15:00:00 via INTRAVENOUS
  Administered 2011-05-15: 0.2 mg via INTRAVENOUS
  Administered 2011-05-16: 03:00:00 via INTRAVENOUS
  Administered 2011-05-16 (×2): 1.99 mg via INTRAVENOUS
  Administered 2011-05-16: 2.79 mg via INTRAVENOUS

## 2011-05-15 MED ORDER — ROSUVASTATIN CALCIUM 10 MG PO TABS
10.0000 mg | ORAL_TABLET | Freq: Every day | ORAL | Status: DC
  Administered 2011-05-15 – 2011-05-17 (×3): 10 mg via ORAL
  Filled 2011-05-15 (×4): qty 1

## 2011-05-15 MED ORDER — LACTATED RINGERS IV SOLN: INTRAVENOUS | Status: DC | PRN

## 2011-05-15 MED ORDER — ACETAMINOPHEN 325 MG PO TABS
650.0000 mg | ORAL_TABLET | ORAL | Status: DC | PRN
  Administered 2011-05-16 – 2011-05-17 (×2): 650 mg via ORAL
  Administered 2011-05-18: 325 mg via ORAL
  Filled 2011-05-15 (×2): qty 2
  Filled 2011-05-15: qty 1

## 2011-05-15 MED ORDER — SODIUM CHLORIDE 0.9 % IR SOLN
Status: DC | PRN
  Administered 2011-05-15 (×3): 1000 mL

## 2011-05-15 MED ORDER — DROPERIDOL 2.5 MG/ML IJ SOLN
INTRAMUSCULAR | Status: DC | PRN
  Administered 2011-05-15: 0.625 mg via INTRAVENOUS

## 2011-05-15 MED ORDER — BUPIVACAINE-EPINEPHRINE 0.25% -1:200000 IJ SOLN
INTRAMUSCULAR | Status: DC | PRN
  Administered 2011-05-15: 8 mL

## 2011-05-15 MED ORDER — DEXTROSE 5 % IV SOLN
INTRAVENOUS | Status: DC | PRN
  Administered 2011-05-15: 09:00:00 via INTRAVENOUS

## 2011-05-15 MED ORDER — PROPOFOL 10 MG/ML IV EMUL
INTRAVENOUS | Status: DC | PRN
  Administered 2011-05-15: 130 mg via INTRAVENOUS

## 2011-05-15 MED ORDER — STERILE WATER FOR INJECTION IJ SOLN: INTRAMUSCULAR | Status: DC | PRN

## 2011-05-15 MED ORDER — INSULIN ASPART 100 UNIT/ML ~~LOC~~ SOLN
0.0000 [IU] | Freq: Three times a day (TID) | SUBCUTANEOUS | Status: DC
  Administered 2011-05-16: 3 [IU] via SUBCUTANEOUS
  Administered 2011-05-16 (×2): 1 [IU] via SUBCUTANEOUS
  Administered 2011-05-17: 3 [IU] via SUBCUTANEOUS
  Administered 2011-05-17 (×2): 2 [IU] via SUBCUTANEOUS
  Administered 2011-05-18: 5 [IU] via SUBCUTANEOUS
  Administered 2011-05-18: 2 [IU] via SUBCUTANEOUS
  Filled 2011-05-15: qty 3

## 2011-05-15 MED ORDER — LIDOCAINE HCL (CARDIAC) 20 MG/ML IV SOLN
INTRAVENOUS | Status: DC | PRN
  Administered 2011-05-15: 60 mg via INTRAVENOUS

## 2011-05-15 MED ORDER — SODIUM CHLORIDE 0.9 % IJ SOLN: 9.0000 mL | INTRAMUSCULAR | Status: DC | PRN

## 2011-05-15 MED ORDER — POTASSIUM CHLORIDE IN NACL 20-0.9 MEQ/L-% IV SOLN
INTRAVENOUS | Status: DC
  Administered 2011-05-15 – 2011-05-18 (×6): via INTRAVENOUS
  Filled 2011-05-15 (×11): qty 1000

## 2011-05-15 MED ORDER — NEOSTIGMINE METHYLSULFATE 1 MG/ML IJ SOLN
INTRAMUSCULAR | Status: DC | PRN
  Administered 2011-05-15: 2.5 mg via INTRAVENOUS

## 2011-05-15 MED ORDER — HYDROMORPHONE HCL 2 MG PO TABS
2.0000 mg | ORAL_TABLET | ORAL | Status: DC | PRN
  Administered 2011-05-16 – 2011-05-18 (×11): 2 mg via ORAL
  Filled 2011-05-15 (×11): qty 1

## 2011-05-15 MED ORDER — SODIUM CHLORIDE 0.9 % IV SOLN
10.0000 mg | INTRAVENOUS | Status: DC | PRN
  Administered 2011-05-15: 10 ug/min via INTRAVENOUS

## 2011-05-15 MED ORDER — SODIUM CHLORIDE 0.9 % IV SOLN
INTRAVENOUS | Status: DC | PRN
  Administered 2011-05-15: 13:00:00 via INTRAVENOUS

## 2011-05-15 MED ORDER — FENTANYL CITRATE 0.05 MG/ML IJ SOLN
INTRAMUSCULAR | Status: DC | PRN
  Administered 2011-05-15: 100 ug via INTRAVENOUS
  Administered 2011-05-15 (×2): 50 ug via INTRAVENOUS
  Administered 2011-05-15: 100 ug via INTRAVENOUS
  Administered 2011-05-15 (×2): 50 ug via INTRAVENOUS
  Administered 2011-05-15: 150 ug via INTRAVENOUS
  Administered 2011-05-15: 100 ug via INTRAVENOUS

## 2011-05-15 MED ORDER — ACETAMINOPHEN 650 MG RE SUPP: 650.0000 mg | RECTAL | Status: DC | PRN

## 2011-05-15 MED ORDER — SODIUM CHLORIDE 0.9 % IJ SOLN: 3.0000 mL | INTRAMUSCULAR | Status: DC | PRN

## 2011-05-15 MED ORDER — HYDROMORPHONE HCL PF 1 MG/ML IJ SOLN
0.2500 mg | INTRAMUSCULAR | Status: DC | PRN
  Administered 2011-05-15 (×3): 0.5 mg via INTRAVENOUS

## 2011-05-15 MED ORDER — PROGESTERONE MICRONIZED 200 MG PO CAPS
200.0000 mg | ORAL_CAPSULE | Freq: Every day | ORAL | Status: DC
  Administered 2011-05-16 – 2011-05-17 (×2): 200 mg via ORAL
  Filled 2011-05-15 (×4): qty 1

## 2011-05-15 MED ORDER — HYDROXYZINE HCL 50 MG/ML IM SOLN
50.0000 mg | INTRAMUSCULAR | Status: DC | PRN
  Filled 2011-05-15: qty 1

## 2011-05-15 MED ORDER — DIPHENHYDRAMINE HCL 12.5 MG/5ML PO ELIX
12.5000 mg | ORAL_SOLUTION | Freq: Four times a day (QID) | ORAL | Status: DC | PRN
  Filled 2011-05-15: qty 5

## 2011-05-15 MED ORDER — GLYCOPYRROLATE 0.2 MG/ML IJ SOLN
INTRAMUSCULAR | Status: DC | PRN
  Administered 2011-05-15: .3 mg via INTRAVENOUS

## 2011-05-15 MED ORDER — PROPOFOL 10 MG/ML IV EMUL
INTRAVENOUS | Status: DC | PRN
  Administered 2011-05-15: 75 ug/kg/min via INTRAVENOUS

## 2011-05-15 MED ORDER — ONDANSETRON HCL 4 MG/2ML IJ SOLN: 4.0000 mg | Freq: Four times a day (QID) | INTRAMUSCULAR | Status: DC | PRN

## 2011-05-15 MED ORDER — MIDAZOLAM HCL 5 MG/5ML IJ SOLN
INTRAMUSCULAR | Status: DC | PRN
  Administered 2011-05-15: 2 mg via INTRAVENOUS

## 2011-05-15 SURGICAL SUPPLY — 81 items
APL SKNCLS STERI-STRIP NONHPOA (GAUZE/BANDAGES/DRESSINGS) ×1
BENZOIN TINCTURE PRP APPL 2/3 (GAUZE/BANDAGES/DRESSINGS) ×1 IMPLANT
BLADE SURG ROTATE 9660 (MISCELLANEOUS) IMPLANT
BUR ROUND PRECISION 4.0 (BURR) ×2 IMPLANT
CAGE CONCORDE BULLET 9X8X27 (Cage) ×2 IMPLANT
CAGE SPNL 5D BLT NOSE 27X9X8X (Cage) IMPLANT
CARTRIDGE OIL MAESTRO DRILL (MISCELLANEOUS) IMPLANT
CLOTH BEACON ORANGE TIMEOUT ST (SAFETY) ×2 IMPLANT
CONT SPEC STER OR (MISCELLANEOUS) ×2 IMPLANT
CORDS BIPOLAR (ELECTRODE) ×2 IMPLANT
COVER SURGICAL LIGHT HANDLE (MISCELLANEOUS) ×2 IMPLANT
DIFFUSER DRILL AIR PNEUMATIC (MISCELLANEOUS) ×1 IMPLANT
DRAIN CHANNEL 15F RND FF W/TCR (WOUND CARE) ×1 IMPLANT
DRAPE C-ARM 42X72 X-RAY (DRAPES) ×2 IMPLANT
DRAPE C-ARMOR (DRAPES) ×1 IMPLANT
DRAPE ORTHO SPLIT 77X108 STRL (DRAPES) ×2
DRAPE POUCH INSTRU U-SHP 10X18 (DRAPES) ×2 IMPLANT
DRAPE SURG 17X23 STRL (DRAPES) ×6 IMPLANT
DRAPE SURG ORHT 6 SPLT 77X108 (DRAPES) ×1 IMPLANT
DURAPREP 26ML APPLICATOR (WOUND CARE) ×2 IMPLANT
ELECT BLADE 4.0 EZ CLEAN MEGAD (MISCELLANEOUS) ×2
ELECT CAUTERY BLADE 6.4 (BLADE) ×3 IMPLANT
ELECT REM PT RETURN 9FT ADLT (ELECTROSURGICAL) ×2
ELECTRODE BLDE 4.0 EZ CLN MEGD (MISCELLANEOUS) ×1 IMPLANT
ELECTRODE REM PT RTRN 9FT ADLT (ELECTROSURGICAL) ×1 IMPLANT
EVACUATOR SILICONE 100CC (DRAIN) ×1 IMPLANT
GAUZE SPONGE 4X4 12PLY STRL LF (GAUZE/BANDAGES/DRESSINGS) ×1 IMPLANT
GAUZE SPONGE 4X4 16PLY XRAY LF (GAUZE/BANDAGES/DRESSINGS) ×5 IMPLANT
GLOVE BIO SURGEON STRL SZ8 (GLOVE) ×3 IMPLANT
GLOVE BIOGEL PI IND STRL 8 (GLOVE) ×1 IMPLANT
GLOVE BIOGEL PI INDICATOR 8 (GLOVE) ×4
GLOVE BIOGEL PI ORTHO PRO SZ7 (GLOVE) ×1
GLOVE PI ORTHO PRO STRL SZ7 (GLOVE) IMPLANT
GLOVE SURG SS PI 7.0 STRL IVOR (GLOVE) ×2 IMPLANT
GLOVE SURG SS PI 7.5 STRL IVOR (GLOVE) ×3 IMPLANT
GOWN PREVENTION PLUS XLARGE (GOWN DISPOSABLE) ×5 IMPLANT
GOWN STRL NON-REIN LRG LVL3 (GOWN DISPOSABLE) ×3 IMPLANT
IV CATH 14GX2 1/4 (CATHETERS) ×2 IMPLANT
KIT BASIN OR (CUSTOM PROCEDURE TRAY) ×2 IMPLANT
KIT POSITION SURG JACKSON T1 (MISCELLANEOUS) ×2 IMPLANT
KIT ROOM TURNOVER OR (KITS) ×2 IMPLANT
MARKER SKIN DUAL TIP RULER LAB (MISCELLANEOUS) ×2 IMPLANT
NDL HYPO 25GX1X1/2 BEV (NEEDLE) ×1 IMPLANT
NDL SPNL 18GX3.5 QUINCKE PK (NEEDLE) ×2 IMPLANT
NEEDLE BONE MARROW 8GX6 FENEST (NEEDLE) ×1 IMPLANT
NEEDLE HYPO 25GX1X1/2 BEV (NEEDLE) ×2 IMPLANT
NEEDLE SPNL 18GX3.5 QUINCKE PK (NEEDLE) IMPLANT
NS IRRIG 1000ML POUR BTL (IV SOLUTION) ×4 IMPLANT
OIL CARTRIDGE MAESTRO DRILL (MISCELLANEOUS) ×2
PACK LAMINECTOMY ORTHO (CUSTOM PROCEDURE TRAY) ×2 IMPLANT
PACK UNIVERSAL I (CUSTOM PROCEDURE TRAY) ×2 IMPLANT
PACK VITOSS BIOACTIVE 10CC (Neuro Prosthesis/Implant) ×1 IMPLANT
PAD ARMBOARD 7.5X6 YLW CONV (MISCELLANEOUS) ×4 IMPLANT
PATTIES SURGICAL .5 X1 (DISPOSABLE) ×2 IMPLANT
PATTIES SURGICAL .5X1.5 (GAUZE/BANDAGES/DRESSINGS) ×1 IMPLANT
PENCIL BUTTON HOLSTER BLD 10FT (ELECTRODE) IMPLANT
ROD EXPEDIUM 5.5 55MM (Rod) ×2 IMPLANT
SCREW EXPEDIUM POLYAXIAL 7X40M (Screw) ×4 IMPLANT
SCREW POLYAXIAL 7X45MM (Screw) ×2 IMPLANT
SCREW SET SINGLE INNER (Screw) ×4 IMPLANT
SPONGE GAUZE 4X4 12PLY (GAUZE/BANDAGES/DRESSINGS) ×2 IMPLANT
SPONGE INTESTINAL PEANUT (DISPOSABLE) ×2 IMPLANT
SPONGE SURGIFOAM ABS GEL 100 (HEMOSTASIS) ×1 IMPLANT
STRIP CLOSURE SKIN 1/2X4 (GAUZE/BANDAGES/DRESSINGS) ×1 IMPLANT
SURGIFLO TRUKIT (HEMOSTASIS) IMPLANT
SUT BONE WAX W31G (SUTURE) ×1 IMPLANT
SUT MNCRL AB 3-0 PS2 18 (SUTURE) ×2 IMPLANT
SUT MNCRL AB 4-0 PS2 18 (SUTURE) ×1 IMPLANT
SUT VIC AB 0 CT1 18XCR BRD 8 (SUTURE) ×1 IMPLANT
SUT VIC AB 0 CT1 8-18 (SUTURE) ×2
SUT VIC AB 1 CT1 18XCR BRD 8 (SUTURE) ×2 IMPLANT
SUT VIC AB 1 CT1 8-18 (SUTURE) ×4
SUT VIC AB 2-0 CT2 18 VCP726D (SUTURE) ×2 IMPLANT
SYR 20CC LL (SYRINGE) ×2 IMPLANT
SYR BULB IRRIGATION 50ML (SYRINGE) ×2 IMPLANT
SYR CONTROL 10ML LL (SYRINGE) ×4 IMPLANT
TOWEL OR 17X24 6PK STRL BLUE (TOWEL DISPOSABLE) ×2 IMPLANT
TOWEL OR 17X26 10 PK STRL BLUE (TOWEL DISPOSABLE) ×2 IMPLANT
TRAY FOLEY CATH 14FR (SET/KITS/TRAYS/PACK) ×2 IMPLANT
WATER STERILE IRR 1000ML POUR (IV SOLUTION) ×2 IMPLANT
YANKAUER SUCT BULB TIP NO VENT (SUCTIONS) ×2 IMPLANT

## 2011-05-15 NOTE — Transfer of Care (Signed)
Immediate Anesthesia Transfer of Care Note  Patient: CHASTA DESHPANDE  Procedure(s) Performed:  POSTERIOR LUMBAR FUSION 1 WITH HARDWARE REMOVAL - Left sided lumbar 5- sacrum 1 transforaminal lumbar interbody fusion with instrumentation, vitoss, bone marrow aspirate with removal and reinsertion of instrumentation.  Patient Location: PACU  Anesthesia Type: General  Level of Consciousness: awake, oriented, sedated, patient cooperative and responds to stimulation  Airway & Oxygen Therapy: Patient Spontanous Breathing and Patient connected to nasal cannula oxygen  Post-op Assessment: Report given to PACU RN and Post -op Vital signs reviewed and stable  Post vital signs: Reviewed and stable  Complications: No apparent anesthesia complications

## 2011-05-15 NOTE — Anesthesia Procedure Notes (Signed)
Procedure Name: Intubation Date/Time: 05/15/2011 8:45 AM Performed by: Wray Kearns A Pre-anesthesia Checklist: Patient identified, Timeout performed, Emergency Drugs available, Suction available and Patient being monitored Patient Re-evaluated:Patient Re-evaluated prior to inductionOxygen Delivery Method: Circle System Utilized Preoxygenation: Pre-oxygenation with 100% oxygen Intubation Type: IV induction Ventilation: Mask ventilation without difficulty and Oral airway inserted - appropriate to patient size Laryngoscope Size: Mac and 3 Grade View: Grade I Tube type: Oral Tube size: 7.5 mm Number of attempts: 1 Airway Equipment and Method: stylet Placement Confirmation: ETT inserted through vocal cords under direct vision,  breath sounds checked- equal and bilateral and positive ETCO2 Secured at: 21 cm Tube secured with: Tape Dental Injury: Teeth and Oropharynx as per pre-operative assessment

## 2011-05-15 NOTE — Anesthesia Postprocedure Evaluation (Signed)
  Anesthesia Post-op Note  Patient: Kayla [name]  Procedure(s) Performed:  POSTERIOR LUMBAR FUSION 1 WITH HARDWARE REMOVAL - Left sided lumbar 5- sacrum 1 transforaminal lumbar interbody fusion with instrumentation, vitoss, bone marrow aspirate with removal and reinsertion of instrumentation.  Patient Location: PACU  Anesthesia Type: General  Level of Consciousness: sedated  Airway and Oxygen Therapy: Patient Spontanous Breathing and Patient connected to nasal cannula oxygen  Post-op Pain: none  Post-op Assessment: Post-op Vital signs reviewed, Patient's Cardiovascular Status Stable, Respiratory Function Stable and Patent Airway  Post-op Vital Signs: Reviewed and stable  Complications: No apparent anesthesia complications

## 2011-05-15 NOTE — Preoperative (Signed)
Beta Blockers   Reason not to administer Beta Blockers:Pt. took Coreg 6.25Mg  PO@ 0200 Hr. on 05/15/11

## 2011-05-15 NOTE — Progress Notes (Signed)
Pt is s/p posterior lumbar fusion with hardware removal and hardware reinsertion. Pt repts that preop she had L leg numbness, weakness, tingling and pain. Pt also repts that preop she had low back pain as well. Pt repts that she is unable to recognize a different in her preop s/sx at this time while laying. Pt repts that she feels that her L leg numbness, weakness and tingling is "gone" postop. Neuro check is negative. Pt is alert and oriented x 3.  Pt has a JP to low back that is draining mod amount of bldy drainage. Dressing to low back is dry and intact. Foley draining quantity sufficient of pale clear yellow urine without difficulty. Lungs CTA. Pt instructed IS use but will need reinforcement of teaching due to the pt being lethargic on admit to floor. No s/sx cardiac or resp distress and no c/o such. Vital signs stable except pt heart rate noted to be tachy in the low 100's. Heart rate regular rate and rhythm.

## 2011-05-15 NOTE — Anesthesia Preprocedure Evaluation (Addendum)
Anesthesia Evaluation  Patient identified by MRN, date of birth, ID band Patient awake    Reviewed: Allergy & Precautions, H&P , NPO status , Patient's Chart, lab work & pertinent test results, reviewed documented beta blocker date and time   Airway Mallampati: III TM Distance: >3 FB Neck ROM: full    Dental  (+) Teeth Intact   Pulmonary  clear to auscultation        Cardiovascular hypertension, On Home Beta Blockers and On Medications regular Normal    Neuro/Psych    GI/Hepatic   Endo/Other  Well Controlled, Type 2, Oral Hypoglycemic Agents  Renal/GU      Musculoskeletal   Abdominal   Peds  Hematology   Anesthesia Other Findings   Reproductive/Obstetrics                           Anesthesia Physical Anesthesia Plan  ASA: II  Anesthesia Plan: General   Post-op Pain Management:    Induction: Intravenous  Airway Management Planned: Oral ETT  Additional Equipment:   Intra-op Plan:   Post-operative Plan: Extubation in OR  Informed Consent: I have reviewed the patients History and Physical, chart, labs and discussed the procedure including the risks, benefits and alternatives for the proposed anesthesia with the patient or authorized representative who has indicated his/her understanding and acceptance.     Plan Discussed with: CRNA and Surgeon  Anesthesia Plan Comments:         Anesthesia Quick Evaluation

## 2011-05-15 NOTE — H&P (Signed)
PREOPERATIVE H&P  Chief Complaint: Low back pain  HPI: Kayla [name] is a 55 y.o. female who presented to me with increased low back pain s/p injury on 12/24/10  Past Medical History  Diagnosis Date  . Diabetes mellitus   . Hypercholesteremia   . Petit mal   . Hypertension     pt seen Dr. Roselle Locus 05/13/2011    Past Surgical History  Procedure Date  . Lumbar fusion     L3-4-5  . Rotator cuff repair     bilateral  . Knee arthroscopy     bilateral  . Novasure ablation    History   Social History  . Marital Status: Divorced    Spouse Name: N/A    Number of Children: N/A  . Years of Education: N/A   Social History Main Topics  . Smoking status: Never Smoker   . Smokeless tobacco: Never Used  . Alcohol Use:      pt report social  . Drug Use: No  . Sexually Active:    Other Topics Concern  . None   Social History Narrative  . None   Family History  Problem Relation Age of Onset  . Anesthesia problems Neg Hx    Allergies  Allergen Reactions  . Mepergan (Meperidine-Promethazine) Other (See Comments)    Seizures.  . Percocet (Oxycodone-Acetaminophen) Hives and Itching   Prior to Admission medications   Medication Sig Start Date End Date Taking? Authorizing Provider  carisoprodol (SOMA) 350 MG tablet Take 350 mg by mouth 3 (three) times daily as needed. For muscle spasm.    Yes Historical Provider, MD  carvedilol (COREG) 6.25 MG tablet Take 6.25 mg by mouth 2 (two) times daily with a meal.     Yes Historical Provider, MD  hydrochlorothiazide (HYDRODIURIL) 12.5 MG tablet Take 12.5 mg by mouth daily.     Yes Historical Provider, MD  HYDROcodone-acetaminophen (NORCO) 5-325 MG per tablet Take 1-2 tablets by mouth every 6 (six) hours as needed. For pain.    Yes Historical Provider, MD  lamoTRIgine (LAMICTAL) 100 MG tablet Take 100 mg by mouth 2 (two) times daily.     Yes Historical Provider, MD  Liraglutide (VICTOZA Randall) Inject into the skin. Pt will bring in box and  verify dose    Yes Historical Provider, MD  metFORMIN (GLUCOPHAGE) 500 MG tablet Take 500 mg by mouth 2 (two) times daily with a meal.     Yes Historical Provider, MD  Multiple Vitamins-Minerals (MULTIVITAMINS THER. W/MINERALS) TABS Take 1 tablet by mouth daily.     Yes Historical Provider, MD  PRESCRIPTION MEDICATION Apply 1 application topically daily. Progesterone cream 8% (80mg /ml)   Yes Historical Provider, MD  progesterone (PROMETRIUM) 200 MG capsule Take 200 mg by mouth daily.     Yes Historical Provider, MD  quinapril (ACCUPRIL) 20 MG tablet Take 20 mg by mouth at bedtime.     Yes Historical Provider, MD  rosuvastatin (CRESTOR) 10 MG tablet Take 10 mg by mouth daily.     Yes Historical Provider, MD  traMADol (ULTRAM) 50 MG tablet Take 50 mg by mouth 2 (two) times daily as needed. For pain. Maximum dose= 8 tablets per day    Yes Historical Provider, MD     All other systems have been reviewed and were otherwise negative with the exception of those mentioned in the HPI and as above.  Physical Exam: Filed Vitals:   05/15/11 0728  BP: 151/95  Pulse: 83  Temp:  97.7 F (36.5 C)  Resp: 20    General: Alert, no acute distress Cardiovascular: No pedal edema Respiratory: No cyanosis, no use of accessory musculature GI: No organomegaly, abdomen is soft and non-tender Skin: No lesions in the area of chief complaint Neurologic: Sensation intact distally Psychiatric: Patient is competent for consent with normal mood and affect Lymphatic: No axillary or cervical lymphadenopathy  MUSCULOSKELETAL: + TTP low back  Assessment/Plan: LOW BACK PAIN Plan for Procedure(s): POSTERIOR LUMBAR FUSION L5/S1 with instrumentation   Kayla [name] 05/15/2011 7:54 AM

## 2011-05-16 LAB — GLUCOSE, CAPILLARY
Glucose-Capillary: 137 mg/dL — ABNORMAL HIGH (ref 70–99)
Glucose-Capillary: 228 mg/dL — ABNORMAL HIGH (ref 70–99)

## 2011-05-16 MED ORDER — HYDROMORPHONE 0.3 MG/ML IV SOLN
INTRAVENOUS | Status: AC
  Filled 2011-05-16: qty 25

## 2011-05-16 MED ORDER — DIPHENHYDRAMINE HCL 12.5 MG/5ML PO ELIX
12.5000 mg | ORAL_SOLUTION | Freq: Four times a day (QID) | ORAL | Status: DC | PRN
  Administered 2011-05-16 (×3): 12.5 mg via ORAL
  Filled 2011-05-16 (×2): qty 5

## 2011-05-16 NOTE — Progress Notes (Signed)
Pt looks fantastic.  Minimal back pain, no leg pain.  AVSS NVI JP positioned well Dressing cdi  POD #1 after L5-S1 fusion and revision of instrumentation  - up with PT today in TLSO brace with thigh extension - dilaudid for pain - back precautions at all times - likely d/c home Friday or saturday

## 2011-05-16 NOTE — Progress Notes (Addendum)
Physical Therapy Evaluation Patient Details Name: Kayla [name] MRN: [id number] DOB: 1955-11-18 Today's Date: 05/17/2011  Problem List: There is no problem list on file for this patient.   Past Medical History:  Past Medical History  Diagnosis Date  . Diabetes mellitus   . Hypercholesteremia   . Petit mal   . Hypertension     pt seen Dr. Roselle [name] 05/13/2011    Past Surgical History:  Past Surgical History  Procedure Date  . Lumbar fusion     L3-4-5  . Rotator cuff repair     bilateral  . Knee arthroscopy     bilateral  . Novasure ablation     PT Assessment/Plan/Recommendation PT Assessment Clinical Impression Statement: Pt mobility limited by pain and dizziness. Pt left in chair with son present. Will continue to progress mobility and activity tolerence.  PT Recommendation/Assessment: Patient will need skilled PT in the acute care venue PT Problem List: Decreased strength;Decreased range of motion;Decreased activity tolerance;Decreased mobility;Decreased knowledge of precautions;Pain PT Therapy Diagnosis : Difficulty walking;Generalized weakness;Acute pain PT Plan PT Frequency: Min 5X/week PT Treatment/Interventions: DME instruction;Gait training;Stair training;Functional mobility training;Therapeutic activities;Patient/family education PT Recommendation Recommendations for Other Services: OT consult Follow Up Recommendations: Home health PT Equipment Recommended: None recommended by PT PT Goals  Acute Rehab PT Goals PT Goal Formulation: With patient Time For Goal Achievement: 7 days Pt will Roll Supine to Left Side: Independently Pt will go Supine/Side to Sit: Independently Pt will go Sit to Supine/Side: Independently Pt will go Sit to Stand: with modified independence Pt will go Stand to Sit: with modified independence Pt will Transfer Bed to Chair/Chair to Bed: with modified independence Pt will Ambulate: 51 - 150 feet;with supervision;with rolling walker Pt will  Go Up / Down Stairs: Flight;with least restrictive assistive device Additional Goals Additional Goal #1: Pt will verbalize and demonstrate 3/3 back precautions   PT Evaluation Precautions/Restrictions  Precautions Precautions: Back Required Braces or Orthoses: Yes Spinal Brace: Thoracolumbosacral orthotic (applied in sitting w/ R LE extension) Prior Functioning  Home Living Lives With: Kayla [name] Help From: Family Type of Home: House Home Layout: Two level;Full bath on main level;Bed/bath upstairs;Able to live on main level with bedroom/bathroom Alternate Level Stairs-Rails: Can reach both Alternate Level Stairs-Number of Steps: 12 Home Access: Stairs to enter Entrance Stairs-Rails: Can reach both Entrance Stairs-Number of Steps: 4 Bathroom Shower/Tub: Walk-in shower;Door [employer] Toilet: Standard Bathroom Accessibility: Yes How Accessible: Accessible via walker Home Adaptive Equipment: Reacher;Other (comment) (Pt reports that she has access to DME/AE is OT) Additional Comments: 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 Prior Function Level of Independence: Independent with homemaking with ambulation;Independent with gait;Independent with transfers Driving: Yes Vocation: Full time employment Cognition Cognition Arousal/Alertness: Lethargic Overall Cognitive Status: Appears within functional limits for tasks assessed Orientation Level: Oriented X4 Sensation/Coordination Sensation Light Touch: Appears Intact Coordination Gross Motor Movements are Fluid and Coordinated: Yes Fine Motor Movements are Fluid and Coordinated: Yes Extremity Assessment RUE Assessment RUE Assessment: Within Functional Limits LUE Assessment LUE Assessment: Within Functional Limits RLE Assessment RLE Assessment: Exceptions to [facility] RLE Strength RLE Overall Strength: Deficits LLE Assessment LLE Assessment: Exceptions to [facility] LLE Strength LLE Overall Strength: Deficits Mobility (including  Balance) Bed Mobility Bed Mobility: Yes Rolling Left: 3: Mod assist;With rail Left Sidelying to Sit: 4: Min assist;With rails;HOB elevated (comment degrees) Sitting - Scoot to Edge of Bed: With rail;3: Mod assist Sit to Supine - Left: Not tested (comment) Scooting to [facility]:  Not tested (comment) Transfers Transfers: Yes Sit to Stand: 2: Max assist;From elevated surface;Without upper extremity assist;From bed Sit to Stand Details (indicate cue type and reason): assist for anterior wt shift and to lift wt against gravity. pt c/o dizziness.  Stand to Sit: 3: Mod assist;To chair/3-in-1;With upper extremity assist Stand to Sit Details: cues for hand placement assist for controlled descent to chair.  Stand Pivot Transfers: 1: +1 Total assist;From elevated surface;With armrests Stand Pivot Transfer Details (indicate cue type and reason): verbal and tactile cues for technique. Assist to maintain standing balance due to dizziness.  Ambulation/Gait Ambulation/Gait: No Stairs: No Wheelchair Mobility Wheelchair Mobility: No  Posture/Postural Control Posture/Postural Control: No significant limitations Balance Balance Assessed: Yes Static Sitting Balance Static Sitting - Balance Support: Bilateral upper extremity supported Static Sitting - Level of Assistance: 5: Stand by assistance Static Sitting - Comment/# of Minutes: 6 min on EOB with c/o dizziness Exercise    End of Session PT - End of Session Equipment Utilized During Treatment: Gait belt;Back brace Activity Tolerance: Patient limited by fatigue;Patient limited by pain (unable to continue due to unrelenting dizziness) Nurse Communication: Mobility status for transfers;Mobility status for ambulation General Behavior During Session: Lethargic Cognition: WFL for tasks performed Kayla [name] Kayla [name] DPT [id number] Kayla [name] 05/17/2011, 10:42 AM

## 2011-05-16 NOTE — Progress Notes (Signed)
Occupational Therapy Evaluation Patient Details Name: Kayla [name] MRN: [id number] DOB: Oct 29, 1955 Today's Date: 05/16/2011 Time: 13:04-13:42 Ev II; 2 indirect care Problem List: There is no problem list on file for this patient.   Past Medical History:  Past Medical History  Diagnosis Date  . Diabetes mellitus   . Hypercholesteremia   . Petit mal   . Hypertension     pt seen Dr. Roselle Locus 05/13/2011    Past Surgical History:  Past Surgical History  Procedure Date  . Lumbar fusion     L3-4-5  . Rotator cuff repair     bilateral  . Knee arthroscopy     bilateral  . Novasure ablation     OT Assessment/Plan/Recommendation OT Assessment Clinical Impression Statement: Pt seen for OT eval this pm and was agreeable to transfer to 3:1 in bathroom. Pt was Mod A bed mobility secondary to back precautions and Mod A don TLSO w/ R LE exten., when in standing, pt c/o 9/10 pain & diziness. RN notified and BP assessed. Pt assisted back to bed while maintaining precaut. & assisted onto bed pan for tolieting. Will need acute OT and assessment of functional transfers & ADL's due to limited eval today related to pain & BP. 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) Individuals Consulted Consulted and Agree with Results and Recommendations: Patient OT Goals Acute Rehab OT Goals OT Goal Formulation: With patient ADL Goals Pt Will Perform Grooming: with  supervision;Standing at sink ADL Goal: Grooming - Progress: Progressing toward goals Pt Will Perform Upper Body Dressing: with modified independence;Sitting, chair;Sitting, bed ADL Goal: Upper Body Dressing - Progress: Progressing toward goals Pt Will Perform Lower Body Dressing: with min assist;with supervision;Sitting, bed;Sitting, chair;Unsupported;with adaptive equipment;Other (comment);with caregiver independent in assisting (maintaining back precautions) ADL Goal: Lower Body Dressing - Progress: Progressing toward goals Pt Will Perform Tub/Shower Transfer: with min assist;with supervision;with caregiver independent in assisting;Maintaining back safety precautions;with DME;Transfer tub bench ADL Goal: Tub/Shower Transfer - Progress: Progressing toward goals Additional ADL Goal #1: Pt will perform all aspects of toileting w/ supervision & A/E PRN while maintaining back precautions to 3:1 w/ least restrictive device (?RW). ADL Goal: Additional Goal #1 - Progress: Progressing toward goals  OT Evaluation Precautions/Restrictions  Precautions Precautions: Back Required Braces or Orthoses: Yes Spinal Brace: Thoracolumbosacral orthotic (applied in sitting w/ R LE extension) Restrictions Weight Bearing Restrictions: No Prior Functioning Home Living Lives With: [name] Help From: Family Type of Home: House Home Layout: Two level;Full bath on main level;Bed/bath upstairs;Able to live on main level with bedroom/bathroom Alternate Level Stairs-Rails: Can reach both Alternate Level Stairs-Number of Steps: 12 Home Access: Stairs to enter Entrance Stairs-Rails: Can reach both Entrance Stairs-Number of Steps: 4 Bathroom Shower/Tub: Walk-in shower;Door [employer] Toilet: Standard Bathroom Accessibility: Yes How Accessible: Accessible via walker Home Adaptive Equipment: Reacher;Other (comment) (Pt reports that she has access to DME/AE is OT) Additional Comments: 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 Prior Function Level of Independence: Independent with homemaking with ambulation;Independent with gait;Independent with transfers Driving: Yes Vocation: Full time employment ADL ADL  Eating/Feeding: Performed;Modified independent Where Assessed - Eating/Feeding: Bed level Grooming: Performed;Wash/dry face;Modified independent Where Assessed - Grooming: Supine, head of bed up Toilet Transfer: Performed;Moderate assistance Toilet Transfer Details (indicate cue type and reason): Pt wanted to attempt amb to bathroom & 3:1 however became orthostatic w/ standing after TLSO applied & was assisted back to bed w/ mod assist & log roll onto bedpan afterwards w/ Mod assist secondary to back precautions Toilet Transfer Method: Other (comment) (seee above) Toilet Transfer Equipment: Other (comment) (bedpan secondary to c/o dizziness & decreased BP) Toileting - Clothing Manipulation: Performed;Minimal assistance Where Assessed - Toileting Clothing Manipulation: Rolling right and/or left;Supine, head of bed flat Toileting - Hygiene: Performed;Moderate assistance Where Assessed - Toileting Hygiene: Rolling right and/or left Equipment Used: Rolling walker;Other (comment) (TLSO; bedpan secondary to pt w/ decreased BP in standing) ADL Comments: Pt seen for OT eval this pm and was agreeable to transfer to 3:1 in bathroom. Pt was Mod A bed mobility secondary to back precautions and Mod A don TLSO w/ R LE exten., when in standing, pt c/o 9/10 pain & diziness. RN notified and BP assessed. Pt assisted back to bed while maintaining precaut. & assisted onto bed pan for tolieting. Will need acute OT and assessment of functional transfers & ADL's due to limited eval today. Vision/Perception  Vision - History Patient Visual Report: No change from baseline Cognition Cognition Arousal/Alertness: Lethargic Overall Cognitive Status: Appears within functional limits for tasks assessed Orientation Level:  Oriented X4 Sensation/Coordination Sensation Light Touch: Appears Intact Coordination Gross Motor Movements are Fluid and Coordinated: Yes Fine Motor Movements are Fluid and Coordinated: Yes Extremity Assessment RUE Assessment RUE Assessment: Within Functional Limits LUE Assessment LUE Assessment: Within Functional Limits Mobility  Bed Mobility Bed Mobility: Yes Rolling Left: 3: Mod assist;With rail Left Sidelying to Sit: 4: Min assist;With rails;HOB elevated (comment degrees) Left Sidelying to Sit Details (indicate cue type and reason): HOB elevated approx 20 degrees Sitting - Scoot to Edge of Bed: With rail;3: Mod assist;4: Min assist Sit to Supine - Left: 3: Mod assist;HOB flat Scooting to HOB: 3: Mod assist;With rail Scooting to [facility] Details (indicate cue type and reason): Increased time and VC's for back precautions   End of Session OT - End of Session Equipment Utilized During Treatment: Gait belt;Other (comment) (TLSO w/ R LE extension; bed pan) Activity Tolerance: Patient limited by pain;Patient limited by fatigue;Other (comment) (decreased BP in standing noted pt c/o dizziness) Patient left: in bed;with call bell in reach;Other (comment) (Nurse tech to assist w/ bathing & remove foley) Nurse Communication: Other (comment) (Pain level & BP w/ activity) General Behavior During Session: Lethargic Cognition: WFL for tasks performed   Alm Bustard 05/16/2011, 2:05 PM

## 2011-05-16 NOTE — Op Note (Signed)
[name], [name] NO.:  [id number]  MEDICAL RECORD NO.:  [id number]  LOCATION:  5033                         FACILITY:  [facility]  PHYSICIAN:  Estill Bamberg, MD      DATE OF BIRTH:  November 18, 1955  DATE OF PROCEDURE:  05/15/2011 DATE OF DISCHARGE:                              OPERATIVE REPORT   PREOPERATIVE DIAGNOSES: 1. Left-sided lumbar radiculopathy. 2. L5-S1 degenerative disk disease, causing severe axial low back     pain. 3. Status post L3-L5 spinal fusion.  POSTOPERATIVE DIAGNOSES: 1. Left-sided lumbar radiculopathy. 2. L5-S1 degenerative disk disease, causing severe axial low back     pain. 3. Status post L3-L5 spinal fusion.  PROCEDURES: 1. Left-sided transforaminal lumbar interbody fusion, L5-S1. 2. Right-sided posterolateral fusion, L5-S1. 3. Removal of instrumentation, L3-L5. 4. Reinsertion of spinal instrumentation L4, L5. 5. Placement of nonsegmental instrumentation, L5-S1. 6. Insertion of interbody device x1 (8-mm lordotic Concorde bulleted     cage). 7. Use of local autograft. 8. Intraoperative bone marrow aspiration from the patient's left iliac     crest using a separate incision. 9. Exploration of previous spinal fusion. 10.Intraoperative use of fluoroscopy.  SURGEON:  Estill Bamberg, MD  ASSISTANT:  Janace Litten, OPA  ANESTHESIA:  General endotracheal anesthesia.  COMPLICATIONS:  None.  DISPOSITION:  Stable  ESTIMATED BLOOD LOSS:  500 mL.  INDICATIONS FOR PROCEDURE:  Briefly, Kayla [name] is a very pleasant 55- year-old female who is status post an L3-L5 fusion on June 17, 2007. The patient did well from that procedure, but went on to have a significant increase in her pain on December 24, 2010.  This did result in unremitting and severe low back pain.  She also had left leg pain, but her back pain is clearly her biggest concern.  I evaluated the patient on April 12, 2011.  Radiographs and MRI were notable for  degenerative disk disease associated with the L5-S1 level.  There is also neural foraminal stenosis causing compression of the left-sided L5 nerve.  The patient did fail conservative care.  The patient was not interested in going forward with any injections.  Given her failure of conservative care, we did have a discussion regarding going ahead with removing the previous instrumentation and going forward with an L5-S1 posterior spinal fusion with reinsertion of her L4 and L5 instrumentation.  The patient fully understood the risks and limitations of the procedure as outlined in my preoperative note.  Of note, the patient is diabetic and she did also understand that this does increase her risk for infection as well as a nonunion and the patient was therefore arranged to have a bone stimulator and a TLSO brace with a thigh extension postoperatively.  OPERATIVE DETAILS:  On May 15, 2011, the patient was brought to surgery and general endotracheal anesthesia was administered.  The patient was placed prone on a well-padded spinal table with a Wilson frame.  All bony prominences were meticulously padded.  The ulnar nerve was bilaterally protected.  Neurologic monitoring was used throughout the procedure.  SCDs were placed and antibiotics were given.  The back was then prepped and draped in usual sterile fashion and a time-out procedure  was performed.  I then made an incision in line with the patient's previous incision and extending more inferiorly.  The scar overlying the epidural space was readily identified.  The fascia was also identified and was incised over the midline.  I then carried my dissection laterally and the previously placed Zimmer instrumentation was readily noted.  The L3, L4, and L5 screws were removed uneventfully, as was a cross connector and two 5.5-mm rods.  The screws removed were 5.5 x 40-mm screws at both the L3 through L4 and the L5 levels.  I  then subperiosteally exposed the transverse processes and sacral ala on the right side.  This was also done on the left side.  The right side was then packed with Ray-Tec soaked with thrombin.  I then turned my attention towards the instrumentation aspect of the procedure.  Using lateral fluoroscopy, I did use a 4-mm high-speed bur to gain access into the S1 pedicle on both the right and the left sides.  I then used a gearshift probe followed by a 6-mm tap.  A ball-tip Feeler was used to confirm that there was no cortical violation.  Bone wax was placed into the cannulated pedicles.  I then turned my attention towards the left side.  The interspace between L5 and S1 was identified and was taken down.  A lamina spreader was placed across the interspace.  The ligamentum flavum was taken down at the L5-S1 interspace.  The inferior articular process of L5 was removed followed by the superior articular process of S1.  The traversing S1 nerve was readily identified.  The traversing S1 nerve was gently retracted medially and a liberal annulotomy was made using a 15-blade knife with an assistant holding medial retraction of the traversing S1 nerve.  I then went forward with a diskectomy in the usual manner.  I did use a series of curettes and pituitary rongeurs in order to perform a thorough diskectomy.  I then placed a series of trials and felt the lordotic 8-mm trial would be the most appropriate fit.  A Concorde bulleted trial, 27 mm in length, was selected.  At this point, I made a separate incision over the patient's iliac crest and a total of 7 mL of bone marrow aspirate was aspirated from the patient's iliac crest on the left side.  This was mixed with 10 mL of Vitoss BA and this was mixed with the autograft obtained from removing the facet joint on the left side.  This mixture was liberally packed into the interbody space and also into the interbody device.  The interbody device was then  tamped into position using AP and lateral fluoroscopy to confirm appropriate positioning.  I did note an excellent press fit.  The remainder of the interspace was packed with the Vitoss BA/bone marrow aspirate/autograft mixture.  I then placed 7 x 40-mm screws at L4 and L5 on the left side, and a 7 x 45-mm screw was placed at S1 on the left side.  Of note, I did intentionally gain access to the anterior cortex of the sacrum in order to help optimize the purchase of the sacral screws.  A 55-mm rod was placed into position and caps were then placed over the screws, and a final locking procedure was performed.  I then turned my attention towards the patient's right side. At this point, the wound was copiously irrigated with approximately 1 L of normal saline.  I did use a 4-mm bur to thoroughly and  adequately decorticate the transverse processes of L4-L5 as well as the fusion mass in the sacral ala.  I then liberally packed remainder of the Vitoss/autograft/bone marrow aspirate mixture into the posterolateral gutter on the right side.  A 7 x 40-mm screws were placed at L4 and L5 on the right side followed by a 7 x 45-mm screw placed at S1.  Of note, I did use triggered EMG to test each of the screws.  There was no screw that tested below 20 milliamps.  Again, a 55-mm rod was placed over the top of the screws and caps were placed and a final locking procedure was performed.  I did obtain AP and lateral fluoroscopic views, and I was extremely pleased with the appearance on both AP and lateral fluoroscopy.  Of note, prior to placing the screws, I did thoroughly explore the fusion mass at the L3-4 and L4-5 interspaces.  There was noted to be a solid fusion across each interspace.  At this point, a Blake drain was placed deep to the fascia.  All epidural bleeding was noted to be thoroughly addressed.  The fascia was then closed using #1 Vicryl.  The subcutaneous layer was then closed using 2-0  Vicryl.  The skin was closed using 3-0 Monocryl.  All instrument counts were correct at the termination of the procedure.  Of note, Janace Litten was my assistant throughout the surgery and aided in essential retraction and suctioning required throughout the surgery.     Estill Bamberg, MD     MD/MEDQ  D:  05/15/2011  T:  05/16/2011  Job:  [id number]  cc:   Candyce Churn. Allyne Gee, M.D.

## 2011-05-17 LAB — GLUCOSE, CAPILLARY
Glucose-Capillary: 163 mg/dL — ABNORMAL HIGH (ref 70–99)
Glucose-Capillary: 217 mg/dL — ABNORMAL HIGH (ref 70–99)
Glucose-Capillary: 245 mg/dL — ABNORMAL HIGH (ref 70–99)

## 2011-05-17 MED ORDER — DIPHENHYDRAMINE HCL 12.5 MG/5ML PO ELIX
25.0000 mg | ORAL_SOLUTION | Freq: Four times a day (QID) | ORAL | Status: DC | PRN
  Administered 2011-05-17 – 2011-05-18 (×4): 25 mg via ORAL
  Filled 2011-05-17 (×4): qty 10

## 2011-05-17 MED ORDER — HYDROCHLOROTHIAZIDE 12.5 MG PO CAPS
12.5000 mg | ORAL_CAPSULE | Freq: Every day | ORAL | Status: DC
  Administered 2011-05-17 – 2011-05-18 (×2): 12.5 mg via ORAL
  Filled 2011-05-17 (×2): qty 1

## 2011-05-17 MED ORDER — METHOCARBAMOL 500 MG PO TABS
500.0000 mg | ORAL_TABLET | Freq: Four times a day (QID) | ORAL | Status: DC | PRN
  Administered 2011-05-17 – 2011-05-18 (×5): 500 mg via ORAL
  Filled 2011-05-17 (×6): qty 1

## 2011-05-17 MED ORDER — OXYCODONE HCL 10 MG PO TB12
10.0000 mg | ORAL_TABLET | Freq: Two times a day (BID) | ORAL | Status: DC
  Administered 2011-05-17 – 2011-05-18 (×3): 10 mg via ORAL
  Filled 2011-05-17 (×3): qty 1

## 2011-05-17 MED FILL — Heparin Sodium (Porcine) Inj 1000 Unit/ML: INTRAMUSCULAR | Qty: 30 | Status: AC

## 2011-05-17 MED FILL — Sodium Chloride Irrigation Soln 0.9%: Qty: 3000 | Status: AC

## 2011-05-17 MED FILL — Sodium Chloride IV Soln 0.9%: INTRAVENOUS | Qty: 1000 | Status: AC

## 2011-05-17 NOTE — Progress Notes (Signed)
CARE MANAGEMENT NOTE 05/17/2011 Patient is under worker's comp. called the office [phone]. They are closed while relocating until Monday 05/20/11. Left detailed message explaining pt's need for DME and that I contacted [facility] to obtain. They will bill worker's comp.

## 2011-05-17 NOTE — Progress Notes (Signed)
Occupational Therapy Treatment Patient Details Name: Kayla [name] MRN: [id number] DOB: [birth year] Today's Date: 05/17/2011  OT Assessment/Plan OT Assessment/Plan Comments on Treatment Session: Pt progressing towards goals OT Plan: Discharge plan remains appropriate OT Frequency: Min 2X/week Follow Up Recommendations: Home health OT;24 hour supervision/assistance Equipment Recommended: Tub/shower seat (shower seat per pt request) OT Goals Acute Rehab OT Goals OT Goal Formulation: With patient Time For Goal Achievement: 7 days ADL Goals Pt Will Perform Grooming: with supervision;Standing at sink ADL Goal: Grooming - Progress: Progressing toward goals Pt Will Perform Upper Body Dressing: with modified independence;Sitting, chair;Sitting, bed ADL Goal: Upper Body Dressing - Progress: Not addressed Pt Will Perform Lower Body Dressing: with min assist;with supervision;Sitting, bed;Sitting, chair;Unsupported;with adaptive equipment;Other (comment);with caregiver independent in assisting ADL Goal: Lower Body Dressing - Progress: Not addressed Pt Will Perform Tub/Shower Transfer: with min assist;with supervision;with caregiver independent in assisting;Maintaining back safety precautions;with DME;Transfer tub bench ADL Goal: Tub/Shower Transfer - Progress: Not addressed Additional ADL Goal #1: Pt will perform all aspects of toileting w/ supervision & A/E PRN while maintaining back precautions to 3:1 w/ least restrictive device (?RW). ADL Goal: Additional Goal #1 - Progress: Progressing toward goals  OT Treatment Precautions/Restrictions  Precautions Precautions: Back Required Braces or Orthoses: Yes Spinal Brace: Thoracolumbosacral orthotic Restrictions Weight Bearing Restrictions: No Sensation Light Touch: Appears Intact Coordination Gross Motor Movements are Fluid and Coordinated: Yes Fine Motor Movements are Fluid and Coordinated: Yes ADL ADL Grooming: Performed;Teeth  care Where Assessed - Grooming: Sitting, chair Toilet Transfer: Performed;Minimal assistance Toilet Transfer Details (indicate cue type and reason): VC for sequencing Toilet Transfer Method: Stand pivot Toilet Transfer Equipment: Bedside commode Toileting - Clothing Manipulation: Performed;Minimal assistance Where Assessed - Toileting Clothing Manipulation: Standing Toileting - Hygiene: Performed;Set up Where Assessed - Toileting Hygiene: Sit to stand from 3-in-1 or toilet Mobility  Bed Mobility Bed Mobility: Yes Rolling Left: 4: Min assist;With rail Rolling Left Details (indicate cue type and reason): VC for technique Left Sidelying to Sit: 4: Min assist;With rails Left Sidelying to Sit Details (indicate cue type and reason): assist to raise shoulders from bed Sitting - Scoot to Edge of Bed: 4: Min assist Sit to Supine - Left: Not tested (comment) Scooting to [facility]: Not tested (comment) Transfers Transfers: Yes Sit to Stand: 4: Min assist;From bed;From chair/3-in-1;With upper extremity assist;With armrests Sit to Stand Details (indicate cue type and reason): assist for anterior wt shift and to lift wt against gravity. pt c/o dizziness.  Stand to Sit: 4: Min assist;To chair/3-in-1;With armrests;With upper extremity assist Stand to Sit Details: cues for hand placement assist for controlled descent to chair.  Exercises    End of Session OT - End of Session Equipment Utilized During Treatment: Back brace Activity Tolerance: Patient limited by pain Patient left: in chair;with call bell in reach;with family/visitor present General Behavior During Session: [facility] for tasks performed Cognition: [facility] for tasks performed  Cipriano Mile  05/17/2011, 1:24 PM 05/17/2011 Cipriano Mile OTR/L Pager [phone] Office [phone]

## 2011-05-17 NOTE — Progress Notes (Signed)
Pt looks great. Minimal back pain with activity, no leg pain. States that Valium not helping with spasms.  AVSS  NVI  JP positioned well - output 100 last shift Dressing cdi   POD #2 after L5-S1 fusion and revision of instrumentation  - up with PT today in TLSO brace with thigh extension  - dilaudid for pain  - back precautions at all times  - likely d/c home tomorrow - maintain JP x 1 more day - bedside commode and walker for home - add robaxin and oxycontin 10 mg BID

## 2011-05-17 NOTE — Progress Notes (Signed)
Physical Therapy Treatment Patient Details Name: Kayla [name] MRN: [id number] DOB: 08-28-1955 Today's Date: 05/17/2011  PT Assessment/Plan  PT - Assessment/Plan Comments on Treatment Session: Pt. is making good mobility gains today with less interference from dizziness.  Pt. and son educated on donning and doffing brace properly. PT Plan: Discharge plan remains appropriate PT Frequency: Min 5X/week Follow Up Recommendations: Home health PT Equipment Recommended: None recommended by PT (pt. reports she has access to equipment) PT Goals  Acute Rehab PT Goals PT Goal: Sit to Stand - Progress: Progressing toward goal PT Goal: Stand to Sit - Progress: Progressing toward goal PT Goal: Ambulate - Progress: Progressing toward goal PT Goal: Up/Down Stairs - Progress: Progressing toward goal  PT Treatment Precautions/Restrictions  Precautions Precautions: Back Required Braces or Orthoses: Yes Spinal Brace: Thoracolumbosacral orthotic (with loeg extension) Restrictions Weight Bearing Restrictions: No Mobility (including Balance) Bed Mobility Bed Mobility: Yes Rolling Left: 4: Min assist;With rail Rolling Left Details (indicate cue type and reason): VC's for technique and back precautions Left Sidelying to Sit: 4: Min assist Left Sidelying to Sit Details (indicate cue type and reason): upper body assist Sitting - Scoot to Edge of Bed: 4: Min assist Sit to Supine - Left: 4: Min assist Sit to Supine - Left Details (indicate cue type and reason): for LE's Transfers Transfers: Yes Sit to Stand: 4: Min assist;From bed;With upper extremity assist Sit to Stand Details (indicate cue type and reason): for rising to stand Stand to Sit: 4: Min assist;To bed;With upper extremity assist Stand to Sit Details: cues for hand placement Ambulation/Gait Ambulation/Gait: Yes Ambulation/Gait Assistance: 4: Min assist Ambulation/Gait Assistance Details (indicate cue type and reason): good sequence with  RW Ambulation Distance (Feet): 200 Feet Assistive device: Rolling walker Gait Pattern: Within Functional Limits Stairs: Yes Stairs Assistance: 4: Min assist Stairs Assistance Details (indicate cue type and reason): cues for hand placement on left rail and helper on right side (hand hold assist) Stair Management Technique: One rail Left;Step to pattern;Forwards Number of Stairs: 3     Exercise    End of Session PT - End of Session Equipment Utilized During Treatment: Gait belt;Back brace;Other (comment) (back brace donned in standing at EOB with emphasis on erect ) Activity Tolerance: Patient tolerated treatment well Patient left: in bed General Behavior During Session: [facility] for tasks performed Cognition: [facility] for tasks performed  Ferman Hamming 05/17/2011, 3:51 PM Acute Rehabilitation Services [phone] (pager)

## 2011-05-18 DIAGNOSIS — M545 Low back pain: Secondary | ICD-10-CM

## 2011-05-18 LAB — GLUCOSE, CAPILLARY: Glucose-Capillary: 268 mg/dL — ABNORMAL HIGH (ref 70–99)

## 2011-05-18 MED ORDER — OXYCODONE HCL 10 MG PO TB12: 10.0000 mg | ORAL_TABLET | Freq: Two times a day (BID) | ORAL | Status: AC

## 2011-05-18 MED ORDER — BISACODYL 5 MG PO TBEC: 5.0000 mg | DELAYED_RELEASE_TABLET | Freq: Every day | ORAL | Status: DC | PRN

## 2011-05-18 MED ORDER — MAGNESIUM HYDROXIDE 400 MG/5ML PO SUSP
15.0000 mL | Freq: Every day | ORAL | Status: DC
Start: 2011-05-18 — End: 2011-05-18
  Administered 2011-05-18: 15 mL via ORAL
  Filled 2011-05-18: qty 30

## 2011-05-18 MED ORDER — OXYCODONE-ACETAMINOPHEN 5-325 MG PO TABS: 1.0000 | ORAL_TABLET | ORAL | Status: AC | PRN

## 2011-05-18 MED ORDER — METHOCARBAMOL 500 MG PO TABS: 500.0000 mg | ORAL_TABLET | Freq: Four times a day (QID) | ORAL | Status: AC | PRN

## 2011-05-18 NOTE — Discharge Summary (Signed)
Patient ID: Kayla [name] MRN: [id number] DOB/AGE: [birth year] 54 y.o.  Admit date: 05/15/2011 Discharge date: 05/19/2011  Admission Diagnoses:  Principal Problem:  *Low back pain radiating to left leg   Discharge Diagnoses:  Same  Past Medical History  Diagnosis Date  . Diabetes mellitus   . Hypercholesteremia   . Petit mal   . Hypertension     pt seen Dr. Roselle Locus 05/13/2011     Surgeries: Procedure(s): POSTERIOR LUMBAR FUSION 1 WITH HARDWARE REMOVAL on 05/15/2011   Consultants:    Discharged Condition: Improved  Hospital Course: Kayla [name] is an 55 y.o. female who was admitted 05/15/2011 for operative treatment ofLow back pain radiating to left leg. Patient has severe unremitting pain that affects sleep, daily activities, and work/hobbies. After pre-op clearance the patient was taken to the operating room on 05/15/2011 and underwent  Procedure(s): POSTERIOR LUMBAR FUSION 1 WITH HARDWARE REMOVAL.    Patient was given perioperative antibiotics: Anti-infectives     Start     Dose/Rate Route Frequency Ordered Stop   05/15/11 1700   ceFAZolin (ANCEF) IVPB 1 g/50 mL premix        1 g 100 mL/hr over 30 Minutes Intravenous Every 8 hours 05/15/11 1622 05/16/11 0056   05/14/11 1515   ceFAZolin (ANCEF) IVPB 2 g/50 mL premix        2 g 100 mL/hr over 30 Minutes Intravenous 60 min pre-op 05/14/11 1511 05/15/11 0830           Patient was given sequential compression devices, early ambulation, and chemoprophylaxis to prevent DVT.  Patient benefited maximally from hospital stay and there were no complications.    Recent vital signs:  Patient Vitals for the past 24 hrs:  BP Temp Temp src Pulse Resp SpO2  05/18/11 1407 125/65 mmHg 98.5 F (36.9 C) Oral 99  18  100 %     Recent laboratory studies: No results found for this basename: WBC:2,HGB:2,HCT:2,PLT:2,NA:2,K:2,CL:2,CO2:2,BUN:2,CREATININE:2,GLUCOSE:2,PT:2,INR:2,CALCIUM,2: in the last 72 hours   Discharge  Medications:  Discharge Medication List as of 05/18/2011  2:10 PM    START taking these medications   Details  methocarbamol (ROBAXIN) 500 MG tablet Take 1 tablet (500 mg total) by mouth every 6 (six) hours as needed., Starting 05/18/2011, Until Tue 05/28/11, Print    oxyCODONE (OXYCONTIN) 10 MG 12 hr tablet Take 1 tablet (10 mg total) by mouth every 12 (twelve) hours., Starting 05/18/2011, Until Sat 06/01/11, Print    oxyCODONE-acetaminophen (ROXICET) 5-325 MG per tablet Take 1 tablet by mouth every 4 (four) hours as needed for pain (breakthrough pain)., Starting 05/18/2011, Until Tue 05/28/11, Print      CONTINUE these medications which have NOT CHANGED   Details  carvedilol (COREG) 6.25 MG tablet Take 6.25 mg by mouth 2 (two) times daily with a meal.  , Until Discontinued, Historical Med    hydrochlorothiazide (HYDRODIURIL) 12.5 MG tablet Take 12.5 mg by mouth daily.  , Until Discontinued, Historical Med    lamoTRIgine (LAMICTAL) 100 MG tablet Take 100 mg by mouth 2 (two) times daily.  , Until Discontinued, Historical Med    Liraglutide (VICTOZA [place]) Inject into the skin. Pt will bring in box and verify dose , Until Discontinued, Historical Med    metFORMIN (GLUCOPHAGE) 500 MG tablet Take 500 mg by mouth 2 (two) times daily with a meal.  , Until Discontinued, Historical Med    Multiple Vitamins-Minerals (MULTIVITAMINS THER. W/MINERALS) TABS Take 1 tablet by mouth daily.  , Until Discontinued,  Historical Med    PRESCRIPTION MEDICATION Apply 1 application topically daily. Progesterone cream 8% (80mg /ml), Until Discontinued, Historical Med    progesterone (PROMETRIUM) 200 MG capsule Take 200 mg by mouth daily.  , Until Discontinued, Historical Med    quinapril (ACCUPRIL) 20 MG tablet Take 20 mg by mouth at bedtime.  , Until Discontinued, Historical Med    rosuvastatin (CRESTOR) 10 MG tablet Take 10 mg by mouth daily.  , Until Discontinued, Historical Med      STOP taking these  medications     carisoprodol (SOMA) 350 MG tablet      HYDROcodone-acetaminophen (NORCO) 5-325 MG per tablet      traMADol (ULTRAM) 50 MG tablet         Diagnostic Studies: Dg Chest 2 View  05/14/2011  *RADIOLOGY REPORT*  Clinical Data: Preop.  Hypertension and diabetes.  CHEST - 2 VIEW  Comparison: Chest radiograph 06/12/2007  Findings: The heart, mediastinal, and hilar contours are within normal limits and stable.  Slightly low lung volumes.  No focal airspace disease, effusion, or pneumothorax.  The trachea is midline. The superior aspect of postoperative changes of the lumbar spine is imaged.  Lumbar spine hardware is not completely included on the study.  No acute bony abnormality identified.  IMPRESSION: No acute cardiopulmonary disease.  Original Report Authenticated By: Britta Mccreedy, M.D.   Dg Lumbar Spine 2-3 Views  05/15/2011  *RADIOLOGY REPORT*  Clinical Data: Lumbar fusion  LUMBAR SPINE - 2-3 VIEW  Comparison: MRI 02/05/2011  Findings: Bilateral pedicle screw fusion L4-5 and L5-S1.  Screws at L3 have been removed.  There remain interbody spacers at L3-4 as well as at L4-5 and L5-S1.  No acute bony abnormality.  IMPRESSION: Pedicle screw and interbody fusion L4-5 and L5-S1.  L5-S1  has been fused  since the prior MRI.  Pedicle screws have been removed at L3 since the prior MRI.  Original Report Authenticated By: Camelia Phenes, M.D.   Dg C-arm 61-120 Min  05/16/2011  *RADIOLOGY REPORT*  Clinical Data: C-ARM 60-120 MINUTES  Fluoroscopy was utilized by the requesting physician. No radiograph ic interpretation.  C-ARM 61-120 MIN  Comparison: None.  Findings:  IMPRESSION:  Original Report Authenticated By: [id number]    Disposition: Home or Self Care  Discharge Orders    Future Orders Please Complete By Expires   Increase activity slowly      Driving Restrictions      Comments:   No driving for 2 weeks.   Call MD for:  temperature >100.4      Call MD for:  severe uncontrolled pain       Call MD for:  redness, tenderness, or signs of infection (pain, swelling, redness, odor or green/yellow discharge around incision site)      Discharge instructions      Comments:   F/U with Dr. Yevette Edwards as scheduled.  Follow post-op instructions from the office.         SignedNestor Lewandowsky 05/19/2011, 11:05 AM

## 2011-05-18 NOTE — Progress Notes (Signed)
PATIENT ID: Kayla [name]  MRN: [id number]  DOB/AGE:  [birth year] / 55 y.o.  3 Days Post-Op Procedure(s) (LRB): POSTERIOR LUMBAR FUSION 1 WITH HARDWARE REMOVAL (Left)    PROGRESS NOTE Subjective: Patient is alert, oriented,noNausea, no Vomiting, yes passing gas, no Bowel Movement. Taking PO well. Denies SOB, Chest or Calf Pain. Using Incentive Spirometer, PAS in place. Ambulating well with PT. Patient reports pain as mild  .    Objective: Vital signs in last 24 hours: Filed Vitals:   05/17/11 0558 05/17/11 1300 05/17/11 2210 05/18/11 0557  BP: 111/75 108/65 134/83 113/73  Pulse: 99 107 108 92  Temp: 98.9 F (37.2 C) 98.5 F (36.9 C) 98.5 F (36.9 C) 97.5 F (36.4 C)  TempSrc: Oral Oral Oral Oral  Resp: 18 18 18 18   Height:      Weight:      SpO2: 98% 100% 100% 100%      Intake/Output from previous day: I/O last 3 completed shifts: In: 1656 [I.V.:1656] Out: 165 [Drains:165]   Intake/Output this shift:     LABORATORY DATA:  Basename 05/17/11 2205 05/17/11 1635 05/17/11 0720  WBC -- -- --  HGB -- -- --  HCT -- -- --  PLT -- -- --  NA -- -- --  K -- -- --  CL -- -- --  CO2 -- -- --  BUN -- -- --  CREATININE -- -- --  GLUCOSE -- -- --  GLUCAP 245* 217* 163*  INR -- -- --  CALCIUM -- -- --    Examination: Neurologically intact ABD soft Neurovascular intact Sensation intact distally Dorsiflexion/Plantar flexion intact Incision: dressing C/D/I} - drain output 165 so drain was uncharged until 12:00.  Will pull later if output low.  Assessment:   3 Days Post-Op Procedure(s) (LRB): POSTERIOR LUMBAR FUSION 1 WITH HARDWARE REMOVAL (Left) ADDITIONAL DIAGNOSIS:    Plan: PT/OT WBAT  DISCHARGE PLAN: Home today if drain is pulled.  DISCHARGE NEEDS: Dan Humphreys

## 2011-05-18 NOTE — Progress Notes (Signed)
Physical Therapy Treatment Patient Details Name: Kayla [name] MRN: [id number] DOB: December 12, 1955 Today's Date: 05/18/2011  PT Assessment/Plan  PT - Assessment/Plan Comments on Treatment Session: Pt motivated and progressing well.  PT Plan: Discharge plan remains appropriate PT Frequency: 7X/week Follow Up Recommendations: Home health PT Equipment Recommended: None recommended by PT PT Goals  Acute Rehab PT Goals PT Goal: Sit to Stand - Progress: Progressing toward goal PT Goal: Stand to Sit - Progress: Progressing toward goal PT Goal: Ambulate - Progress: Met PT Goal: Up/Down Stairs - Progress: Progressing toward goal Additional Goals PT Goal: Additional Goal #1 - Progress: Met  PT Treatment Precautions/Restrictions  Precautions Precautions: Back Required Braces or Orthoses: Yes Spinal Brace: Thoracolumbosacral orthotic;Applied in standing position (WITH LEG BRACE) Restrictions Weight Bearing Restrictions: No Mobility (including Balance) Bed Mobility Rolling Left: 5: Supervision Rolling Left Details (indicate cue type and reason): cues for log roll Left Sidelying to Sit: 5: Supervision;With rails;HOB flat Sitting - Scoot to Edge of Bed: 6: Modified independent (Device/Increase time) Transfers Sit to Stand: 5: Supervision Sit to Stand Details (indicate cue type and reason): cue for safe hand placement Stand to Sit: 5: Supervision Ambulation/Gait Ambulation/Gait: Yes Ambulation/Gait Assistance: 5: Supervision Ambulation Distance (Feet): 250 Feet Assistive device: Rolling walker Gait Pattern: Step-to pattern;Decreased stride length Gait velocity: decreased Stairs: Yes Stairs Assistance: 4: Min assist Stairs Assistance Details (indicate cue type and reason): A for HHA and one rail Stair Management Technique: One rail Right Number of Stairs: 10     Exercise    End of Session PT - End of Session Equipment Utilized During Treatment: Back brace Activity Tolerance:  Patient tolerated treatment well Patient left: in chair;with call bell in reach General Behavior During Session: [facility] for tasks performed Cognition: [facility] for tasks performed  Fredrich Birks 05/18/2011, 10:54 AM  05/18/2011 Fredrich Birks PTA [phone] pager [phone] office

## 2011-05-20 LAB — GLUCOSE, CAPILLARY: Glucose-Capillary: 158 mg/dL — ABNORMAL HIGH (ref 70–99)

## 2011-07-11 ENCOUNTER — Other Ambulatory Visit: Payer: Self-pay | Admitting: Internal Medicine

## 2011-07-11 DIAGNOSIS — Z1231 Encounter for screening mammogram for malignant neoplasm of breast: Secondary | ICD-10-CM

## 2011-07-16 ENCOUNTER — Ambulatory Visit: Payer: Self-pay

## 2011-08-06 ENCOUNTER — Ambulatory Visit
Admission: RE | Admit: 2011-08-06 | Discharge: 2011-08-06 | Disposition: A | Discharge status: Home / Self Care | Payer: BC Managed Care – PPO | Source: Ambulatory Visit | Attending: Internal Medicine | Admitting: Internal Medicine

## 2011-08-06 DIAGNOSIS — Z1231 Encounter for screening mammogram for malignant neoplasm of breast: Secondary | ICD-10-CM

## 2011-08-15 ENCOUNTER — Ambulatory Visit (INDEPENDENT_AMBULATORY_CARE_PROVIDER_SITE_OTHER): Payer: BC Managed Care – PPO | Admitting: Obstetrics and Gynecology

## 2011-08-21 ENCOUNTER — Ambulatory Visit (INDEPENDENT_AMBULATORY_CARE_PROVIDER_SITE_OTHER): Payer: BC Managed Care – PPO | Admitting: Obstetrics and Gynecology

## 2011-08-21 DIAGNOSIS — Z01419 Encounter for gynecological examination (general) (routine) without abnormal findings: Secondary | ICD-10-CM

## 2011-09-20 ENCOUNTER — Ambulatory Visit: Payer: Self-pay | Admitting: Internal Medicine

## 2011-12-12 ENCOUNTER — Encounter: Payer: Self-pay | Admitting: Dietician

## 2011-12-12 ENCOUNTER — Encounter: Payer: BC Managed Care – PPO | Attending: *Deleted | Admitting: Dietician

## 2011-12-12 DIAGNOSIS — Z713 Dietary counseling and surveillance: Secondary | ICD-10-CM | POA: Insufficient documentation

## 2011-12-12 DIAGNOSIS — E119 Type 2 diabetes mellitus without complications: Secondary | ICD-10-CM | POA: Insufficient documentation

## 2011-12-12 NOTE — Progress Notes (Signed)
Medical Nutrition Therapy:  Appt start time: 0800 end time:  0900.   Assessment:  Primary concerns today: Had surgery to her back and during recovery, experienced a decrease in appetite and is finding that she is not eating regularly. Has a history of type 2 diabetes for about 10 years. Most recent A1C is 6.2% on 11/26/11. Concerned that she will have blood glucose control issues with not eating at regular times. Current intake on assessment is adequate, but she has with recovery established a routine of staying up late, then sleeping late and having a late morning meal, then a larger evening meal and snack.  There are times during the day where she is skipping meals. Interested in re-establishing a "more normal routine."  MEDICATIONS: Completed medication review.  BLOOD GLUCOSE MONITORING: Currently monitoring fasting levels daily.  They range from 110-130-158 fasting.    HYPOGLYCEMIA:  Gives no history of S/S of low blood glucose.  HYPERGLYCEMIA: Gives no history at this time of the S/S of hyperglycemia   DIETARY INTAKE:  Usual eating pattern includes 1-2-3  meals and 1-3 snacks per day.  Everyday foods include fruit, vegetables, starch .  Avoided foods include Regular soda and sugar sweetened beverages.    24-hr recall:  B ( AM): 9:30 (generally, will not eat before 12:00-1:00 PM)  Smoothie and cup of fruit.  Other wise will eat breakfast at mid-day and then have an evening meal.  (12-1:00) Sandwich corn beef and half of it or small hamberger, few fries,  Water or diet Dr. Reino Kent. Snk ( AM): none  L ( PM): Some days if up early will have a mid-day meal, (3:00 PM) Corn beef sandwich and ate 50% of the sandwich and 1 pack of chips.  Local restaurant.  Diet tea. Snk ( PM): none D ( PM): 9:00 PM, will often just eat to take her medicine. 3 small slices of [place], slice of cheddar cheese,Ritz crackers, 4 small slices watermelon. Snk ( PM): usually not, but if no dinner meal may snack slowly  into the evening Beverages: water, smoothie, diet soda.  Usual physical activity: Yes, walking 2 times per week for about 2 miles.  Looking to increase this.   Estimated energy needs:HT: 63 in  WT: 151.3 lb  BMI: 26.9 kg/m2  Goal weight is 145 lb. 1400-1500 calories 160-165 g carbohydrates 105-110 g protein 38-40 g fat  Progress Towards Goal(s):  In progress.   Nutritional Diagnosis:  [place]-2.1 Inpaired nutrition utilization As related to glucose.  As evidenced by diagnosis of type 2 diabetes for 10 years, and a variable A1C over that time frame..    Intervention:  Nutrition Completed a review of the carb restricted diet for blood glucose control.  Discussed measures for increasing protein in her diet for tissue healing following her surgery.  Discussed the nutritional needs for losing and maintaining her weight at 145 lb.   Consider using a smoothie in the morning made using a low carb protein powder such as UnJury and a skim or 1% milk and fruit.  Talk to Chales Abrahams regarding decreasing the Victoza to the next dose down and let your appetite come through.  Remember that with this, you will need to pay more attention to your carb counting.  Reference:  Calorieking.com app on some phones.   My Fitness Pal.  Start to try to snack through out the day.  Small amounts about every 3 hours.    Try to have a protein source at  all meals and snacks.  Peach and handful of almonds.  Slowly try to add more vegetables.     Handouts given during visit include:  Living Well with diabetes  Blood Glucose Control  Snack list  Menu of 45 gm CHO per meal  Resource for purchasing Unjury protein powder.  Monitoring/Evaluation:  Dietary intake, exercise, blood glucose levesl, and body weight in 8-12 weeks.

## 2011-12-12 NOTE — Patient Instructions (Addendum)
   Consider using a smoothie in the morning made using a low carb protein powder such as UnJury and a skim or 1% milk and fruit.  Talk to Kayla [name] regarding decreasing the Victoza to the next dose down and let your appetite come through.  Remember that with this, you will need to pay more attention to your carb counting.  Reference:  Calorieking.com app on some phones.   My Fitness Pal.  Start to try to snack through out the day.  Small amounts about every 3 hours.    Try to have a protein source at all meals and snacks.  Peach and handful of almonds.  Slowly try to add more vegetables.

## 2011-12-15 ENCOUNTER — Encounter: Payer: Self-pay | Admitting: Dietician

## 2012-06-01 ENCOUNTER — Telehealth: Payer: Self-pay | Admitting: Obstetrics and Gynecology

## 2012-06-01 NOTE — Telephone Encounter (Signed)
Pt states that she is not having any relief from menopausal medication. Last apt was 08/21/11. Advised pt that she is needing apt. Scheduled for 06/09/12 @ 4:00 p.m with EP  Pt voiced understanding  Darien Ramus, CMA

## 2012-06-09 ENCOUNTER — Ambulatory Visit: Payer: BC Managed Care – PPO | Admitting: Obstetrics and Gynecology

## 2012-06-18 ENCOUNTER — Encounter: Payer: Self-pay | Admitting: Obstetrics and Gynecology

## 2012-06-18 ENCOUNTER — Ambulatory Visit: Payer: Managed Care, Other (non HMO) | Admitting: Obstetrics and Gynecology

## 2012-06-18 VITALS — BP 106/70 | Wt 165.0 lb

## 2012-06-18 DIAGNOSIS — R5383 Other fatigue: Secondary | ICD-10-CM

## 2012-06-18 DIAGNOSIS — Z1321 Encounter for screening for nutritional disorder: Secondary | ICD-10-CM

## 2012-06-18 DIAGNOSIS — G44229 Chronic tension-type headache, not intractable: Secondary | ICD-10-CM

## 2012-06-18 DIAGNOSIS — F419 Anxiety disorder, unspecified: Secondary | ICD-10-CM

## 2012-06-18 DIAGNOSIS — R569 Unspecified convulsions: Secondary | ICD-10-CM | POA: Insufficient documentation

## 2012-06-18 DIAGNOSIS — E119 Type 2 diabetes mellitus without complications: Secondary | ICD-10-CM | POA: Insufficient documentation

## 2012-06-18 DIAGNOSIS — N951 Menopausal and female climacteric states: Secondary | ICD-10-CM

## 2012-06-18 DIAGNOSIS — I10 Essential (primary) hypertension: Secondary | ICD-10-CM | POA: Insufficient documentation

## 2012-06-18 DIAGNOSIS — R232 Flushing: Secondary | ICD-10-CM

## 2012-06-18 LAB — CBC
MCH: 29.5 pg (ref 26.0–34.0)
MCV: 86.4 fL (ref 78.0–100.0)
Platelets: 266 10*3/uL (ref 150–400)
RBC: 4.4 MIL/uL (ref 3.87–5.11)

## 2012-06-18 NOTE — Progress Notes (Signed)
Menopausal symptoms:anxiety, hot flashes, insomnia, moodiness, vaginal dryness  The patient is taking hormone replacement therapy  progesterone The patient  is not taking a Calcium supplement. The patient participates in regular exercise: no. Post-menopausal bleeding:no  The patient is sexually active.  Last Pap: was normal March  2013 Last mammogram: was normal February  2013 Last DEXA scan : 2008  History of DVT/PE: No Family history of breast cancer: No Family history of endometrial cancer:No

## 2012-06-18 NOTE — Patient Instructions (Signed)
To develop good sleep habits:    Go to bed and get up  at the same time each day (even on your days off)  Avoid caffeine, alcohol or nicotine at least 4-6 hours before bedtime  If you haven't fallen asleep within 15 minutes of getting in the bed, get up and do something non-stimulating  until you feel sleepy again,  then return to bed.  Only try to sleep when you are actually sleepy.  Do not watch TV, read, write, play games or talk on the phone in bed.  Only use the bed for sleep and sex  Do not nap or remain  in the bed if you are awake  Do not go to bed too  hungry or  too full   Develop a routine prior to bedtime so that your body will get a signal that bedtime is near  Do not do anything stimulating before bedtime  Make sure that your bedroom is comfortable for sleeping   

## 2012-06-18 NOTE — Progress Notes (Signed)
56 YO on progesterone cream complaining of hot flashes, anxiety, moodiness and vaginal dryness.  States that symptoms worsened  a few months ago. Doesn't sleep well, has headaches (throbbing) at least 2 weeks out of the month (only relieved with Marlin Canary Powder), eating erractically, feels tired all the time and out of sorts.  Requests referral to a therapist for anxiety.  Was told by surgeon, after back surgery last year that her stamina may not totally return but states she feels drained most of the time.  A: Menopausal Symptoms (weight gain, moodiness, hot flashes)     Anxiety     Fatigue/Malaise     Headaches with H/O Migraines     Vaginal Dryness      P: Refer to therapist for anxiety-Jane Rosen-Grandon       Reviewed hormonal, herbal and misc. management options for       menopausal symptoms.  Patient doesn't want hormonal therapy but      will try herbal       Brochure given on Estrovera (rhubarb root) available at [facility]        K-Y Intrigue in vagina 3 times a week and with intercourse if needed       D/C Progesterone Cream       Sleep hygiene       CMET, TSH, Vitamin D & CBC-pending        F/U with Dr. Sandria Manly for headaches         RTO-as scheduled or prn  Landyn Buckalew, PA-C

## 2012-06-19 ENCOUNTER — Telehealth: Payer: Self-pay

## 2012-06-19 LAB — COMPREHENSIVE METABOLIC PANEL
AST: 15 U/L (ref 0–37)
Albumin: 4 g/dL (ref 3.5–5.2)
Alkaline Phosphatase: 58 U/L (ref 39–117)
BUN: 14 mg/dL (ref 6–23)
Potassium: 4 mEq/L (ref 3.5–5.3)
Sodium: 140 mEq/L (ref 135–145)
Total Protein: 7 g/dL (ref 6.0–8.3)

## 2012-06-19 LAB — VITAMIN D 25 HYDROXY (VIT D DEFICIENCY, FRACTURES): Vit D, 25-Hydroxy: 33 ng/mL (ref 30–89)

## 2012-06-19 NOTE — Telephone Encounter (Signed)
Tc to pt regarding lab results. Per EP, informed pt that her lab results were normal so [place] advised pt to try the things they discussed x 4 weeks and if sx are not better to give [place] a call back. Pt voiced understanding.

## 2012-06-19 NOTE — Telephone Encounter (Signed)
Message copied by Winfred Leeds on Fri Jun 19, 2012  9:14 AM ------      Message from: Henreitta Leber      Created: Fri Jun 19, 2012  8:18 AM       Advise patient that labs are normal  (her sugar/glucose level was not fasting).  Tell her to try the things we talked about yesterday and to call in 4 weeks if symptoms are not better. [Is going to try Estrovera (rhubarb root, sleep instructions, is being referred to a therapist, to take some time off  and is going to see Dr. Sandria Manly about her headaches]  Thank you,  EP

## 2012-09-07 ENCOUNTER — Other Ambulatory Visit: Payer: Self-pay

## 2012-09-07 DIAGNOSIS — Z1231 Encounter for screening mammogram for malignant neoplasm of breast: Secondary | ICD-10-CM

## 2012-09-21 ENCOUNTER — Ambulatory Visit: Payer: Self-pay

## 2012-11-02 ENCOUNTER — Ambulatory Visit
Admission: RE | Admit: 2012-11-02 | Discharge: 2012-11-02 | Disposition: A | Discharge status: Home / Self Care | Payer: Managed Care, Other (non HMO) | Source: Ambulatory Visit

## 2012-11-02 DIAGNOSIS — Z1231 Encounter for screening mammogram for malignant neoplasm of breast: Secondary | ICD-10-CM

## 2013-08-13 ENCOUNTER — Telehealth: Payer: Self-pay | Admitting: Neurology

## 2013-08-13 ENCOUNTER — Telehealth: Payer: Self-pay | Admitting: Diagnostic Neuroimaging

## 2013-08-13 NOTE — Telephone Encounter (Signed)
Gave to Sandy for reassignment 

## 2013-08-13 NOTE — Telephone Encounter (Signed)
Pt called former Dr. Erling Cruz pt, needs an apt. Can we get an assigned Dr. for pt so we can get her in. Thanks

## 2013-08-13 NOTE — Telephone Encounter (Signed)
Assigned to Dr. Leta Baptist.

## 2013-08-27 NOTE — Telephone Encounter (Signed)
Pt called back set up apt 09/30/13 with Dr. Leta Baptist. Thanks

## 2013-09-06 ENCOUNTER — Other Ambulatory Visit: Payer: Self-pay

## 2013-09-06 MED ORDER — LEVETIRACETAM ER 750 MG PO TB24: 750.0000 mg | ORAL_TABLET | Freq: Every day | ORAL | Status: DC

## 2013-09-06 NOTE — Telephone Encounter (Signed)
Former [facility] patient assigned to Dr Leta Baptist.  Has appt scheduled

## 2013-09-30 ENCOUNTER — Encounter (INDEPENDENT_AMBULATORY_CARE_PROVIDER_SITE_OTHER): Payer: Self-pay

## 2013-09-30 ENCOUNTER — Encounter: Payer: Self-pay | Admitting: Diagnostic Neuroimaging

## 2013-09-30 ENCOUNTER — Ambulatory Visit (INDEPENDENT_AMBULATORY_CARE_PROVIDER_SITE_OTHER): Payer: BC Managed Care – PPO | Admitting: Diagnostic Neuroimaging

## 2013-09-30 VITALS — BP 106/70 | HR 88 | Temp 97.3°F | Ht 63.0 in | Wt 163.0 lb

## 2013-09-30 DIAGNOSIS — G40309 Generalized idiopathic epilepsy and epileptic syndromes, not intractable, without status epilepticus: Secondary | ICD-10-CM

## 2013-09-30 DIAGNOSIS — G40B09 Juvenile myoclonic epilepsy, not intractable, without status epilepticus: Secondary | ICD-10-CM

## 2013-09-30 MED ORDER — LEVETIRACETAM ER 750 MG PO TB24: 750.0000 mg | ORAL_TABLET | Freq: Every day | ORAL | Status: AC

## 2013-09-30 NOTE — Patient Instructions (Signed)
Continue levetiracetam XR.
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
## 2014-01-18 ENCOUNTER — Other Ambulatory Visit: Payer: Self-pay

## 2014-01-18 ENCOUNTER — Other Ambulatory Visit: Payer: Self-pay | Admitting: Internal Medicine

## 2014-01-18 DIAGNOSIS — Z1231 Encounter for screening mammogram for malignant neoplasm of breast: Secondary | ICD-10-CM

## 2014-02-10 ENCOUNTER — Inpatient Hospital Stay: Admission: RE | Admit: 2014-02-10 | Payer: Self-pay | Source: Ambulatory Visit

## 2014-02-14 ENCOUNTER — Ambulatory Visit
Admission: RE | Admit: 2014-02-14 | Discharge: 2014-02-14 | Disposition: A | Discharge status: Home / Self Care | Payer: 59 | Source: Ambulatory Visit

## 2014-02-14 ENCOUNTER — Encounter (INDEPENDENT_AMBULATORY_CARE_PROVIDER_SITE_OTHER): Payer: Self-pay

## 2014-02-14 DIAGNOSIS — Z1231 Encounter for screening mammogram for malignant neoplasm of breast: Secondary | ICD-10-CM

## 2014-04-04 ENCOUNTER — Encounter: Payer: Self-pay | Admitting: Diagnostic Neuroimaging

## 2014-07-13 ENCOUNTER — Telehealth: Payer: Self-pay | Admitting: Diagnostic Neuroimaging

## 2014-07-13 NOTE — Telephone Encounter (Signed)
Pt is moving to [place] and needs a referral.  Please call pt and advise as soon as you can.

## 2014-07-14 ENCOUNTER — Telehealth: Payer: Self-pay | Admitting: *Deleted

## 2014-07-14 NOTE — Telephone Encounter (Signed)
Patient received message and stated was already established with PCP and Dr. Daisy Blossom, Neurologist at [facility] prior to moving here.  Dr. Blima Ledger office is requesting a referral to have on file, due to very familiar with patient.  Please call and advise.

## 2014-07-14 NOTE — Telephone Encounter (Signed)
I left a message for the pt on her cell phone regarding her wanting to get a recommendation for a neurologist in [place]. I spoke with Dr. Leta Baptist yesterday and he asked me to call the pt and inform her that she would need to establish a PCP in [place] first and then they would be able to recommend a neurologist. Dr. Leta Baptist recommended [facility].  I also mentioned that once she is established, she simply needs to have her new neurologist request records from our office and we would be happy to send them there. If she has any further questions, she can call back.

## 2014-07-19 ENCOUNTER — Telehealth: Payer: Self-pay | Admitting: *Deleted

## 2014-07-19 ENCOUNTER — Encounter: Payer: Self-pay | Admitting: *Deleted

## 2014-07-19 NOTE — Telephone Encounter (Signed)
Called the pt on the phone and spoke with her about what she needed. She asked for a physical letter from Dr. Leta Baptist asking her to be referred back to her neurologist in [place]. Connected her to Butch Penny in [place] records to inquire about the status of her records. And I informed the pt that I would talk to Dr Leta Baptist and get back with her.

## 2014-07-19 NOTE — Telephone Encounter (Signed)
Called the pt back on her cell phone and informed her that I had her referral letter and that she could come by and pick it up whenever she was ready. Pt stated a thank you for being so prompt and quick.

## 2014-07-25 DIAGNOSIS — Z0289 Encounter for other administrative examinations: Secondary | ICD-10-CM

## 2014-10-03 ENCOUNTER — Ambulatory Visit: Payer: BC Managed Care – PPO | Admitting: Diagnostic Neuroimaging

## 2014-12-19 ENCOUNTER — Other Ambulatory Visit: Payer: Self-pay | Admitting: Diagnostic Neuroimaging

## 2015-01-05 ENCOUNTER — Other Ambulatory Visit: Payer: Self-pay | Admitting: Diagnostic Neuroimaging
